# Patient Record
Sex: Male | Born: 1935 | Race: White | Hispanic: No | Marital: Married | Smoking: Former smoker
Health system: Southern US, Community
[De-identification: ages and names within clinical notes are randomized; demographics above are authoritative.]

## PROBLEM LIST (undated history)

## (undated) DIAGNOSIS — I2510 Atherosclerotic heart disease of native coronary artery without angina pectoris: Secondary | ICD-10-CM

## (undated) DIAGNOSIS — I10 Essential (primary) hypertension: Secondary | ICD-10-CM

## (undated) DIAGNOSIS — I5030 Unspecified diastolic (congestive) heart failure: Secondary | ICD-10-CM

## (undated) DIAGNOSIS — Z7901 Long term (current) use of anticoagulants: Secondary | ICD-10-CM

## (undated) DIAGNOSIS — F1010 Alcohol abuse, uncomplicated: Secondary | ICD-10-CM

## (undated) DIAGNOSIS — K219 Gastro-esophageal reflux disease without esophagitis: Secondary | ICD-10-CM

## (undated) DIAGNOSIS — E785 Hyperlipidemia, unspecified: Secondary | ICD-10-CM

## (undated) DIAGNOSIS — I4891 Unspecified atrial fibrillation: Secondary | ICD-10-CM

## (undated) DIAGNOSIS — J449 Chronic obstructive pulmonary disease, unspecified: Secondary | ICD-10-CM

## (undated) HISTORY — DX: Atherosclerotic heart disease of native coronary artery without angina pectoris: I25.10

## (undated) HISTORY — DX: Long term (current) use of anticoagulants: Z79.01

## (undated) HISTORY — DX: Alcohol abuse, uncomplicated: F10.10

## (undated) HISTORY — PX: BREAST SURGERY: SHX581

## (undated) HISTORY — DX: Hyperlipidemia, unspecified: E78.5

## (undated) HISTORY — DX: Chronic obstructive pulmonary disease, unspecified: J44.9

---

## 1997-09-03 ENCOUNTER — Ambulatory Visit (HOSPITAL_BASED_OUTPATIENT_CLINIC_OR_DEPARTMENT_OTHER): Admission: RE | Admit: 1997-09-03 | Discharge: 1997-09-03 | Payer: Self-pay | Admitting: General Surgery

## 1999-06-17 ENCOUNTER — Inpatient Hospital Stay (HOSPITAL_COMMUNITY): Admission: EM | Admit: 1999-06-17 | Discharge: 1999-06-19 | Payer: Self-pay | Admitting: Emergency Medicine

## 1999-06-17 ENCOUNTER — Encounter: Payer: Self-pay | Admitting: Cardiology

## 1999-06-18 ENCOUNTER — Encounter: Payer: Self-pay | Admitting: Cardiology

## 1999-06-18 HISTORY — PX: TRANSESOPHAGEAL ECHOCARDIOGRAM: SHX273

## 1999-06-18 HISTORY — PX: CARDIOVERSION: SHX1299

## 2002-03-09 HISTORY — PX: CARDIAC CATHETERIZATION: SHX172

## 2003-01-09 ENCOUNTER — Ambulatory Visit (HOSPITAL_COMMUNITY): Admission: RE | Admit: 2003-01-09 | Discharge: 2003-01-09 | Payer: Self-pay | Admitting: Cardiovascular Disease

## 2005-03-23 ENCOUNTER — Encounter: Admission: RE | Admit: 2005-03-23 | Discharge: 2005-03-23 | Payer: Self-pay | Admitting: General Surgery

## 2007-04-05 ENCOUNTER — Encounter: Admission: RE | Admit: 2007-04-05 | Discharge: 2007-04-05 | Payer: Self-pay | Admitting: Cardiology

## 2008-12-27 ENCOUNTER — Encounter: Admission: RE | Admit: 2008-12-27 | Discharge: 2008-12-27 | Payer: Self-pay | Admitting: Cardiology

## 2009-10-22 ENCOUNTER — Ambulatory Visit: Payer: Self-pay | Admitting: Cardiology

## 2009-11-13 ENCOUNTER — Ambulatory Visit: Payer: Self-pay | Admitting: Cardiology

## 2009-12-13 ENCOUNTER — Ambulatory Visit: Payer: Self-pay | Admitting: Cardiology

## 2010-01-10 ENCOUNTER — Ambulatory Visit: Payer: Self-pay | Admitting: Cardiology

## 2010-02-10 ENCOUNTER — Ambulatory Visit: Payer: Self-pay | Admitting: Cardiology

## 2010-02-24 ENCOUNTER — Ambulatory Visit: Payer: Self-pay | Admitting: Cardiology

## 2010-03-31 ENCOUNTER — Ambulatory Visit: Payer: Self-pay | Admitting: Cardiology

## 2010-04-14 ENCOUNTER — Ambulatory Visit (INDEPENDENT_AMBULATORY_CARE_PROVIDER_SITE_OTHER): Payer: Medicare Other | Admitting: Cardiology

## 2010-04-14 DIAGNOSIS — I4891 Unspecified atrial fibrillation: Secondary | ICD-10-CM

## 2010-04-14 DIAGNOSIS — J449 Chronic obstructive pulmonary disease, unspecified: Secondary | ICD-10-CM

## 2010-04-14 DIAGNOSIS — R0602 Shortness of breath: Secondary | ICD-10-CM

## 2010-05-12 ENCOUNTER — Encounter (INDEPENDENT_AMBULATORY_CARE_PROVIDER_SITE_OTHER): Payer: Medicare Other

## 2010-05-12 DIAGNOSIS — I4891 Unspecified atrial fibrillation: Secondary | ICD-10-CM

## 2010-05-12 DIAGNOSIS — Z7901 Long term (current) use of anticoagulants: Secondary | ICD-10-CM

## 2010-07-11 ENCOUNTER — Telehealth: Payer: Self-pay | Admitting: Cardiology

## 2010-07-11 NOTE — Telephone Encounter (Signed)
Tried to call busy. Will try Monday-needs pt

## 2010-07-11 NOTE — Telephone Encounter (Signed)
Scott Reese

## 2010-07-14 NOTE — Telephone Encounter (Signed)
Called patient this morning about appointments and protimes.  Patient has not had a protime since march, stating they would not make any appointment for him when he checked out.  Patient has an appointment to get protime and see Dr. Patty Sermons on may 14.  Advised he should have protime checked before, but he wanted to wait.  Asked if he had any increase in bruising, stated no.  Did say he had been dizzy. Offered an appointment to see Lawson Fiscal, Dr. Patty Sermons out of the office this week.  He declined.  Advised to call back if he changed his mind.

## 2010-07-17 ENCOUNTER — Encounter: Payer: Self-pay | Admitting: Cardiology

## 2010-07-18 DIAGNOSIS — I509 Heart failure, unspecified: Secondary | ICD-10-CM | POA: Insufficient documentation

## 2010-07-18 DIAGNOSIS — J441 Chronic obstructive pulmonary disease with (acute) exacerbation: Secondary | ICD-10-CM | POA: Insufficient documentation

## 2010-07-18 DIAGNOSIS — I2510 Atherosclerotic heart disease of native coronary artery without angina pectoris: Secondary | ICD-10-CM | POA: Insufficient documentation

## 2010-07-18 DIAGNOSIS — R0600 Dyspnea, unspecified: Secondary | ICD-10-CM | POA: Insufficient documentation

## 2010-07-18 DIAGNOSIS — E785 Hyperlipidemia, unspecified: Secondary | ICD-10-CM | POA: Insufficient documentation

## 2010-07-18 DIAGNOSIS — R1013 Epigastric pain: Secondary | ICD-10-CM | POA: Insufficient documentation

## 2010-07-18 DIAGNOSIS — I480 Paroxysmal atrial fibrillation: Secondary | ICD-10-CM | POA: Insufficient documentation

## 2010-07-21 ENCOUNTER — Ambulatory Visit (INDEPENDENT_AMBULATORY_CARE_PROVIDER_SITE_OTHER): Payer: Medicare Other | Admitting: *Deleted

## 2010-07-21 ENCOUNTER — Ambulatory Visit (INDEPENDENT_AMBULATORY_CARE_PROVIDER_SITE_OTHER): Payer: Medicare Other | Admitting: Cardiology

## 2010-07-21 ENCOUNTER — Encounter: Payer: Self-pay | Admitting: Cardiology

## 2010-07-21 DIAGNOSIS — I2510 Atherosclerotic heart disease of native coronary artery without angina pectoris: Secondary | ICD-10-CM

## 2010-07-21 DIAGNOSIS — R0989 Other specified symptoms and signs involving the circulatory and respiratory systems: Secondary | ICD-10-CM

## 2010-07-21 DIAGNOSIS — I4891 Unspecified atrial fibrillation: Secondary | ICD-10-CM

## 2010-07-21 DIAGNOSIS — R0600 Dyspnea, unspecified: Secondary | ICD-10-CM

## 2010-07-21 DIAGNOSIS — I480 Paroxysmal atrial fibrillation: Secondary | ICD-10-CM

## 2010-07-21 DIAGNOSIS — I482 Chronic atrial fibrillation, unspecified: Secondary | ICD-10-CM | POA: Insufficient documentation

## 2010-07-21 NOTE — Progress Notes (Signed)
Mariel Craft Date of Birth:  Jun 30, 1927 [facility] / [facility] [address]   [address] [place], [place]  [address] [phone]           Fax   [phone]  HPI: This pleasant 77 year old gentleman is seen for a scheduled followup office visit.  He has a long history of atrial fibrillation.  He has known single vessel coronary disease being treated medically.  He said COPD.  He quit smoking about 20 years ago.  His last nuclear study was in 2007 and was negative for ischemia and his ejection fraction was 61%.  He has been having occasional mild dizzy spells if he bends over to pick something up off the floor.  Current Outpatient Prescriptions  Medication Sig Dispense Refill  . allopurinol (ZYLOPRIM) 300 MG tablet Take 300 mg by mouth daily.        Marland Kitchen atorvastatin (LIPITOR) 10 MG tablet Take 10 mg by mouth daily.        Marland Kitchen esomeprazole (NEXIUM) 40 MG capsule Take 40 mg by mouth daily before breakfast.        . furosemide (LASIX) 40 MG tablet Take 40 mg by mouth daily. 4 tablets       . HYDROcodone-acetaminophen (NORCO) 5-325 MG per tablet Take 1 tablet by mouth daily as needed.        . metoprolol (LOPRESSOR) 100 MG tablet Take 100 mg by mouth daily. 1/2 tablet bid       . nitroGLYCERIN (NITROSTAT) 0.4 MG SL tablet Place 0.4 mg under the tongue every 5 (five) minutes as needed.        . potassium chloride (KLOR-CON) 20 MEQ packet Take 20 mEq by mouth daily.       Marland Kitchen warfarin (COUMADIN) 5 MG tablet Take 5 mg by mouth daily. Take as directed by coumadin clinic       . DISCONTD: celecoxib (CELEBREX) 200 MG capsule Take 200 mg by mouth 2 (two) times daily.          No Known Allergies  Patient Active Problem List  Diagnoses  . Coronary artery disease  . Dyspnea  . COPD (chronic obstructive pulmonary disease)  . Paroxysmal atrial fibrillation  . CHF (congestive heart failure)  . Hyperlipidemia  . Dyspepsia  . Chronic atrial fibrillation    History  Smoking status  .  Former Smoker  . Types: Cigarettes  . Quit date: 03/09/1989  Smokeless tobacco  . Not on file    History  Alcohol Use: Not on file    Family History  Problem Relation Age of Onset  . Diabetes Father   . Stroke Brother   . Cancer Daughter     Review of Systems: The patient denies any heat or cold intolerance.  No weight gain or weight loss.  The patient denies headaches or blurry vision.  There is no cough or sputum production.  The patient denies dizziness.  There is no hematuria or hematochezia.  The patient denies any muscle aches or arthritis.  The patient denies any rash.  The patient denies frequent falling or instability.  There is no history of depression or anxiety.  All other systems were reviewed and are negative.   Physical Exam: Filed Vitals:   07/21/10 0914  BP: 132/78  Pulse: 80  The general appearance feels a well-developed well-nourished gentleman in no distress.Pupils equal and reactive.   Extraocular Movements are full.  There is no scleral icterus.  The mouth and  pharynx are normal.  The neck is supple.  The carotids reveal no bruits.  The jugular venous pressure is normal.  The thyroid is not enlarged.  There is no lymphadenopathy.The chest is clear to percussion and auscultation. There are no rales or rhonchi. Expansion of the chest is symmetrical.The precordium is quiet.  The first heart sound is normal.  The second heart sound is physiologically split.  There is no murmur gallop rub or click.  There is no abnormal lift or heave. The rhythm is irregular.  The abdomen is soft and nontender. Bowel sounds are normal. The liver and spleen are not enlarged. There Are no abdominal masses. There are no bruits.The pedal pulses are good.  There is no phlebitis or edema.  There is no cyanosis or clubbing.Strength is normal and symmetrical in all extremities.  There is no lateralizing weakness.  There are no sensory deficits.    Assessment / Plan: Continue present  medication.  His INR is 1.8 and his dose is being increased.  Recheck in 2 months for followupOffice visit and lab work.

## 2010-07-21 NOTE — Assessment & Plan Note (Signed)
The patient has established chronic atrial fibrillation.  He is on long-term Coumadin.  He has not been having any TIA symptoms or stroke.  He denies exertional chest pain.  He does have occasional dizziness if he leans over toPick something up off the floor.

## 2010-07-21 NOTE — Assessment & Plan Note (Signed)
Patient has a history of exertional dyspnea.  He has a history of COPD.  He is a nonsmoker.  Denies any chest pain or angina.

## 2010-07-21 NOTE — Assessment & Plan Note (Signed)
Patient has known single vessel coronary disease.  Last cardiac catheterization was 2004.  He was found at that time to have a 40-50% eccentric stenosis in the left circumflex.  It was not flow-limiting.  The patient is not experiencing any angina pectoris.

## 2010-07-25 NOTE — Discharge Summary (Signed)
[place]. [facility]  Patient:    Scott Reese, FORGET                         MRN: [id number] Adm. Date:  [id number] Disc. Date: [id number] Attending:  Rudean Hitt CC:         Kristian Covey, M.D.                           Discharge Summary  OPERATIONS PERFORMED:  Transesophageal echocardiogram on June 18, 1999, by Dr. Vesta Mixer, Jr., electrical atrial cardioversion on June 19, 1999, by Dr. Clovis Pu. Brackbill.  FINAL DIAGNOSES: 1. Atrial fibrillation, resolved. 2. Hypertensive cardiovascular disease. 3. Hyperlipidemia.  HISTORY OF PRESENT ILLNESS:  This 77 year old, married Caucasian gentleman was admitted on June 17, 1999, with recent onset of recurrent atrial fibrillation.  He had had previous atrial fibrillation in 1997, was hospitalized, and required electrical cardioversion at that time.  He has remained in sinus rhythm for the past four years until recently.  He had been seen at [facility] last week for follow-up of his essential hypertension and was in sinus rhythm then.  Over the past weekend, the patient drank a little excessively and noted that his pulse was quite irregular subsequently and that he was more short of breath.  PHYSICAL EXAMINATION ON ADMISSION:  VITAL SIGNS:  Blood pressure 126/70, pulse 125 in atrial fibrillation, respirations normal.  LUNGS:  Clear.  HEART:  A quiet precordium without murmur, gallop, or rub.  ABDOMEN:  Negative.  EXTREMITIES:  Show good peripheral pulses.  No phlebitis or edema.  LABORATORY DATA:  Electrocardiogram done at Dr. 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  HOSPITAL COURSE:  Patient was admitted to [facility] telemetry.  He was started on IV heparin and also started on Coumadin.  He was given IV Cardizem for rate control.  It was felt  that the patient should undergo a transesophageal echocardiogram to look for possible visible thrombi in the left atrial appendage.  Patient was begun on Rythmol 300 mg every 12 hours for antiarrhythmic therapy at the time of admission.  On June 18, 1999, patient underwent a cardiac transesophageal echocardiogram by Dr. Elease Hashimoto which showed no evidence of any clot in the left atrium or left atrial appendage, and no mitral regurgitation.  There was a very small central jet of aortic insufficiency.  On April 12, the patient underwent a DC cardioversion.  He converted after a single 200-watt-second shock using the AP paddles.  He tolerated the procedure well and was able to be discharged home that same evening to be followed up closely in the office.  DISCHARGE MEDICATIONS: 1. Metoprolol 100 mg each morning. 2. Lipitor 10 mg daily. 3. Accupril 40 mg daily. 4. Rythmol 300 mg twice a day. 5. Coumadin 5 mg daily or as directed. 6. Cardura 8 mg taking a half tablet in the evenings. 7. Hydrochlorothiazide 25 mg a day.  DISCHARGE INSTRUCTIONS:  He is to walk as tolerated.  He is to stay out of work for two weeks.  He will be on a low-cholesterol, low-salt diet.  He is to stop Lanoxin, stop gemfibrozil, and stop Norvasc.  He is to see Dr. Patty Sermons in one week for office visit, electrocardiogram, and prothrombin time.  CONDITION ON DISCHARGE:  Improved. DD:  07/10/99 TD:  07/11/99 Job: [id number]

## 2010-07-25 NOTE — H&P (Signed)
[place]. [facility]  Patient:    Scott Reese, Scott Reese                           MRN: [id number] Adm. Date:  06/17/99 Attending:  Maisie Fus A. Patty Sermons, M.D. CC:         Kristian Covey, M.D.             Alvia Grove., M.D.                         History and Physical  CHIEF COMPLAINT:  New onset of atrial fibrillation.  HISTORY OF PRESENT ILLNESS:  This is a 77 year old, married, Caucasian gentleman admitted with recent onset of atrial fibrillation.  This man had a history of atrial fibrillation in 1997 and was hospitalized at that time.  He failed to convert on medical therapy at that time and had to be cardioverted.  He required a total of three shocks and converted on the final 360 watt second shock after failing at 150 and failing at 300.  He has remained in sinus rhythm since 1997. He was discharged on Lanoxin and quinidine, Lopressor, Hydrochlorothiazide and Coumadin.  Quinidine was subsequently stopped because of GI intolerance and he as remained on Lanoxin and Lopressor and Hydrochlorothiazide.  Coumadin was continued for several months and the stopped. The patient has been lost to follow up from our office since then but has been followed closely by [facility] or his hypertension and in fact was seen last week for a routine visit, at which time he was noted to be in normal rhythm.  Over the weekend within the past two to three days the patient has not felt as well and this morning when checking his blood pressure on his machine at home, he noted that his pulse was quite irregular. e has not been experiencing any chest discomfort but has had some left arm tingling and numbness.  He has not noticed any symptoms of congestive heart failure or increased dyspnea.  He has had no thromboembolic symptoms.  FAMILY HISTORY:  Reveals that his mother died in her 27s of old age.  Father died of diabetes and complications at age 31.   He has one brother who is living and as had two strokes.  SOCIAL HISTORY:  Reveals that Mr. Marhefka is retired.  He ran a milk route for 30 years.  He now works part-time doing some courier work.  He does not smoke any longer but does drink vodka and sometimes drinks heavily up to a pint at a time. He drank heavily over the weekend, 48 hours ago.  ALLERGIES:  He has no known drug allergies.  REVIEW OF SYSTEMS:  Reveals that he does not have any history of peptic ulcer disease. He has had no history of diverticulosis flare up but does have a history of rectal bleeding in the past if he eats a lot of popcorn.  Genitourinary reveals that he has not been experiencing any dysuria but did notice a lot of nocturia nd polyuria last night possibly related to his atrial arrhythmia.  The patient does have a history of BPH. He denies any cough or sputum production.  PRESENT MEDICATIONS: Lopid generic 600 mg b.i.d.  Norvasc 5 mg q.d. Lipitor 10 g q.d.  Lanoxin 0.125 mg q.d.  Accupril 40 mg q.d.  Metoprolol 100 mg q.d. Hydrochlorothiazide 25 mg q.o.d. Cardura 8  mg q.d.  PHYSICAL EXAMINATION:  VITAL SIGNS:  Blood pressure is 126/70, pulse 125 in atrial fibrillation, respirations normal.  HEAD/NECK:  Revealed that this is a bearded gentleman in no acute distress. Pupils are equal and reactive.  Extraocular movements are full.  The fundi show hemorrhages or exudates. Mouth and pharynx are normal with dentures in place. Jugular venous pressure is normal. Carotids are normal. Thyroid not enlarged or  tender.  There is no lymphadenopathy.  LUNGS:  Clear to percussion and auscultation.  HEART:  Reveals a quiet precordium without murmur, gallop, rub or click.  ABDOMEN:  Soft without hepatosplenomegaly or masses.  EXTREMITIES:  Show good peripheral pulses. No phlebitis or edema.  NEUROLOGIC:  Reveals normal deep tendon reflexes and no evidence of hyperthyroidism.  His electrocardiogram from  Dr. Lupita Leash office earlier today was reviewed and does show atrial fibrillation with a rapid ventricular response. He has relatively low voltage in the limb lead and there are no acute ischemic changes.  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  IMPRESSION: 1. New onset of atrial fibrillation probably in the last two to three days. 2. Hypertensive cardiovascular disease. 3. History of hyperlipidemia. 4. History of episodic alcohol excess.  DISPOSITION:  We are going to admit him to [facility] Telemetry.  We will start him on IV heparin and also start Coumadin. Will start IV Cardizem for rate control and will give him one dose of Rythmol 300 mg now.  We will plan a TEE for June 18, 1999 by Dr. Elease Hashimoto to evaluate LA size and to look for any visible thrombi in the left atrial appendage.  If none are found, we will tentatively plan for DC cardioversion on June 19, 1999.  He will be counselled regarding moderation of alcohol intake in the future. DD:  06/17/99 TD:  06/17/99 Job: [id number]

## 2010-07-25 NOTE — Cardiovascular Report (Signed)
   NAME:  Scott Reese, Scott Reese NO.:  [id number]   MEDICAL RECORD NO.:  [id number]                   PATIENT TYPE:  OIB   LOCATION:  2893                                 FACILITY:  [facility]   PHYSICIAN:  Vesta Mixer, M.D.              DATE OF BIRTH:  February 06, 1928   DATE OF PROCEDURE:  DATE OF DISCHARGE:  01/09/2003                              CARDIAC CATHETERIZATION   INDICATIONS:  Mr. Garske is a 77 year old gentleman with a history of chest  pains and fatigue.  He also has shortness of breath especially with bending  over doing any exertion.  He has recently had a stress Cardiolite study  which revealed inferior basilar attenuation.  He is referred for a heart  catheterization for further evaluation.   PROCEDURE:  Left heart catheterization with coronary angiography.  The right  femoral artery was easily cannulated using a modified Seldinger technique.   HEMODYNAMICS:  The LV pressure was 126/14 with an aortic pressure of 126/70.   ANGIOGRAPHY:  The left main is smooth and normal.   The left anterior descending artery is fairly large and is smooth and  normal.  The diagonal branches are normal as well.   The left circumflex artery is a very large vessel.  The proximal vessel is  approximately 4 mm in diameter.  There is a 40-50% eccentric stenosis in the  proximal aspect of the vessel.  The lesion does not appear to obstruct flow.  The circumflex artery gives off a large obtuse marginal artery and has just  a very small distal circumflex vessel.   The right coronary artery is large and dominant.  The posterior descending  artery and the posterolateral segment artery are normal.   LEFT VENTRICULOGRAM:  The left ventriculogram reveals a mildly enlarged left  ventricle.  The left ventricular systolic function is at the lower limits of  normal with an ejection fraction of around 50%.   COMPLICATIONS:  None.    CONCLUSION:  Single vessel coronary artery  disease involving the left  circumflex artery.  The stenosis is moderate in its severity and is probably  only 40-50% in severity.  It does not appear to be flow limiting.  We will  continue with medical therapy.                                               Vesta Mixer, M.D.    PJN/MEDQ  D:  01/09/2003  T:  01/10/2003  Job:  [id number]   cc:   Cassell Clement, M.D.  [address], [address]  [place]  [place] [address]  Fax: [phone]

## 2010-07-25 NOTE — Op Note (Signed)
[place]. [facility]  Patient:    Scott Reese, Scott Reese                           MRN: [id number] Proc. Date: 06/19/99 Attending:  Maisie Fus A. Patty Sermons, M.D. CC:         Kristian Covey, M.D.             Thomas A. Patty Sermons, M.D.                           Operative Report  PROCEDURE:  Direct current cardioversion.  CARDIOLOGIST:  Clovis Pu. Patty Sermons, M.D.  HISTORY:  This 77 year old gentleman was admitted with atrial fibrillation and failed to convert on oral medication.  He has been anticoagulated.  DESCRIPTION OF PROCEDURE:  He was given 200 mg of pentothal by anesthesiologist, Dr. Zoila Shutter.  After suitable anesthesia had been achieved, the patient was given a single 200 watt second discharge and converted promptly to normal sinus rhythm. There were no post anesthetic complications.  IMPRESSION:  Successful direct current cardioversion. DD:  06/19/99 TD:  06/19/99 Job: [id number]

## 2010-07-25 NOTE — H&P (Signed)
NAME:  NICKY, KRAS NO.:  [id number]   MEDICAL RECORD NO.:  [id number]                   PATIENT TYPE:  OIB   LOCATION:                                       FACILITY:  [facility]   PHYSICIAN:  Vesta Mixer, M.D.              DATE OF BIRTH:  31-Aug-1927   DATE OF ADMISSION:  01/09/2003  DATE OF DISCHARGE:                                HISTORY & PHYSICAL   HISTORY OF PRESENT ILLNESS:  Mr. Okelley is an elderly gentleman with a history  of atrial fibrillation, chest pain, hyperlipidemia, and hypertension.  He is  now admitted to the hospital for heart catheterization after having an  abnormal Cardiolite study.   Mr. Malena is an elderly gentleman with a history of atrial fibrillation.  He  has also had pulmonary hypertension and hypertension.  He has overall done  fairly well.  He has not been hospitalized in quite some time.  Recently, he  had some episodes of chest pain.  Dr. Patty Sermons performed a stress  Cardiolite study which revealed some attenuation along the inferior wall.  He is now admitted for further evaluation of these abnormalities.   He continues to have some intermittent episodes of chest pain.  He has not  had any episodes of syncope or presyncope.  He has not had any shortness of  breath.   He is currently therapeutic on his Coumadin.   CURRENT MEDICATIONS:  1. Lopressor 50 mg twice a day.  2. Lipitor 10 mg a day.  3. Coumadin 5 mg seven days a week.  4. Lasix 80 mg twice a day.   ALLERGIES:  No known drug allergies.   PAST MEDICAL HISTORY:  1. Hyperlipidemia.  2. Hypertension.  3. Pulmonary hypertension.  4. Chronic atrial fibrillation.  5. History of anemia.   SOCIAL HISTORY:  The patient is retired.  He used to run a milk route.   FAMILY HISTORY:  His mother died in her 65s of old age.  His father died of  diabetes and other complications at age 75.   REVIEW OF SYSTEMS:  His review of systems was reviewed and is  essentially  negative.  He denies any problems with his eyes, ears, nose, and throat.  He  denies any heat or cold intolerance, weight gain or weight loss.  He denies  any problems with his eyes, ears, nose, and throat, syncope, presyncope,  rash or skin nodules.  He denies any problems with his GI or GU tract.   PHYSICAL EXAMINATION:  GENERAL:  He is an elderly gentleman in no acute  distress.  He is alert and oriented x3.  His mood and affect are normal.  VITAL SIGNS:  His weight is 212, his blood pressure is 120/80, heart rate of  78.  HEENT:  Reveals 2+ carotids.  He has no bruits.  There is no JVD,  no  thyromegaly.  LUNGS:  Clear to auscultation.  HEART:  Irregularly irregular.  He has no murmurs.  ABDOMEN:  Good bowel sounds and is nontender.  EXTREMITIES:  He has no cyanosis, clubbing, or edema.  NEUROLOGIC:  Nonfocal.   Mr. Knighton presents with some episodes of chest pain and has an abnormal  Cardiolite study with evidence of inferior wall ischemia.  We will proceed  with heart catheterization for further evaluation.   We have discussed the risks, benefits, and options of heart catheterization.  He understands and agrees to proceed.                                                 Vesta Mixer, M.D.    PJN/MEDQ  D:  01/04/2003  T:  01/04/2003  Job:  [id number]   cc:   Tammy R. Collins [place], M.D.  [address]  [place]  [place] [address]  Fax: [phone]   Cassell Clement, M.D.  [address], [address]  [place]  [place] [address]  Fax: [phone]

## 2010-08-12 ENCOUNTER — Ambulatory Visit (INDEPENDENT_AMBULATORY_CARE_PROVIDER_SITE_OTHER): Payer: Medicare Other | Admitting: *Deleted

## 2010-08-12 DIAGNOSIS — I4891 Unspecified atrial fibrillation: Secondary | ICD-10-CM

## 2010-08-12 DIAGNOSIS — I480 Paroxysmal atrial fibrillation: Secondary | ICD-10-CM

## 2010-08-12 LAB — POCT INR: INR: 2.6

## 2010-09-09 ENCOUNTER — Ambulatory Visit (INDEPENDENT_AMBULATORY_CARE_PROVIDER_SITE_OTHER): Payer: Medicare Other | Admitting: *Deleted

## 2010-09-09 DIAGNOSIS — I4891 Unspecified atrial fibrillation: Secondary | ICD-10-CM

## 2010-09-09 DIAGNOSIS — I480 Paroxysmal atrial fibrillation: Secondary | ICD-10-CM

## 2010-09-09 LAB — POCT INR: INR: 2.4

## 2010-09-16 ENCOUNTER — Other Ambulatory Visit: Payer: Self-pay | Admitting: *Deleted

## 2010-09-16 DIAGNOSIS — Z79899 Other long term (current) drug therapy: Secondary | ICD-10-CM

## 2010-09-16 DIAGNOSIS — E785 Hyperlipidemia, unspecified: Secondary | ICD-10-CM

## 2010-09-24 ENCOUNTER — Ambulatory Visit (INDEPENDENT_AMBULATORY_CARE_PROVIDER_SITE_OTHER): Payer: Medicare Other | Admitting: Cardiology

## 2010-09-24 ENCOUNTER — Other Ambulatory Visit (INDEPENDENT_AMBULATORY_CARE_PROVIDER_SITE_OTHER): Payer: Medicare Other | Admitting: *Deleted

## 2010-09-24 ENCOUNTER — Ambulatory Visit (INDEPENDENT_AMBULATORY_CARE_PROVIDER_SITE_OTHER): Payer: Medicare Other | Admitting: *Deleted

## 2010-09-24 ENCOUNTER — Other Ambulatory Visit: Payer: Medicare Other | Admitting: *Deleted

## 2010-09-24 ENCOUNTER — Other Ambulatory Visit: Payer: Self-pay | Admitting: Cardiology

## 2010-09-24 ENCOUNTER — Encounter: Payer: Self-pay | Admitting: Cardiology

## 2010-09-24 DIAGNOSIS — Z79899 Other long term (current) drug therapy: Secondary | ICD-10-CM

## 2010-09-24 DIAGNOSIS — I4891 Unspecified atrial fibrillation: Secondary | ICD-10-CM

## 2010-09-24 DIAGNOSIS — R0989 Other specified symptoms and signs involving the circulatory and respiratory systems: Secondary | ICD-10-CM

## 2010-09-24 DIAGNOSIS — I482 Chronic atrial fibrillation, unspecified: Secondary | ICD-10-CM

## 2010-09-24 DIAGNOSIS — I2510 Atherosclerotic heart disease of native coronary artery without angina pectoris: Secondary | ICD-10-CM

## 2010-09-24 DIAGNOSIS — R0600 Dyspnea, unspecified: Secondary | ICD-10-CM

## 2010-09-24 DIAGNOSIS — E785 Hyperlipidemia, unspecified: Secondary | ICD-10-CM

## 2010-09-24 DIAGNOSIS — I480 Paroxysmal atrial fibrillation: Secondary | ICD-10-CM

## 2010-09-24 LAB — LDL CHOLESTEROL, DIRECT: Direct LDL: 74.3 mg/dL

## 2010-09-24 LAB — BASIC METABOLIC PANEL
GFR: 66.89 mL/min (ref >60.00)
Potassium: 5.1 mEq/L (ref 3.5–5.1)
Sodium: 145 mEq/L (ref 135–145)

## 2010-09-24 LAB — HEPATIC FUNCTION PANEL
AST: 25 U/L (ref 0–37)
Alkaline Phosphatase: 53 U/L (ref 39–117)
Total Bilirubin: 0.7 mg/dL (ref 0.3–1.2)

## 2010-09-24 LAB — CBC WITH DIFFERENTIAL/PLATELET
Basophils Absolute: 0 10*3/uL (ref 0.0–0.1)
Hemoglobin: 14.4 g/dL (ref 13.0–17.0)
Lymphocytes Relative: 17.4 % (ref 12.0–46.0)
Monocytes Relative: 8.8 % (ref 3.0–12.0)
Neutro Abs: 4.4 10*3/uL (ref 1.4–7.7)
Neutrophils Relative %: 72.2 % (ref 43.0–77.0)
RDW: 17.4 % — ABNORMAL HIGH (ref 11.5–14.6)

## 2010-09-24 LAB — LIPID PANEL
Cholesterol: 150 mg/dL (ref 0–200)
Total CHOL/HDL Ratio: 4
VLDL: 49 mg/dL — ABNORMAL HIGH (ref 0.0–40.0)

## 2010-09-24 NOTE — Assessment & Plan Note (Signed)
The patient has a history of chronic atrial fibrillation on Coumadin.  He has been careful about his medication and his INRs have been therapeutic.  He has not had any TIA or stroke symptoms

## 2010-09-24 NOTE — Assessment & Plan Note (Signed)
The patient is seen for a scheduled followup office visit.  He does have known coronary artery disease.  He has not been as was any recent chest pain.  He had cardiac catheterization in 2004 showing single-vessel disease which was nonobstructive in the left circumflex.

## 2010-09-24 NOTE — Assessment & Plan Note (Addendum)
The patient has a history of exertional dyspnea.  He is a former smoker.  His last chest x-ray was 12/27/08 and showed moderate cardiomegaly.  He is not coughing up any sputum.  He's had no fever or hemoptysis his last nuclear stress test was 05/06/09 and showed no ischemia and his ejection fraction had improved from 46% to 61%.  His last echocardiogram was in 2005 and showed aortic valve sclerosis with mild aortic insufficiency and mitral annular calcification with moderate mitral regurgitation.

## 2010-09-24 NOTE — Progress Notes (Signed)
Scott Reese Date of Birth:  [birth year] [facility] / [facility] [address]   [address] [place], [place]  [address] [phone]           Fax   [phone]  History of Present Illness: This pleasant 77 year old gentleman is seen for a scheduled followup office visit.  He has a past history of exertional dyspnea.  He is a former smoker and has COPD.  He also has known cardiomegaly.  His last chest x-ray was in 12/27/08 and showed moderate cardiomegaly as well as patchy atelectasis.  The patient had a nuclear stress test 05/06/09 showing normal perfusion and an ejection fraction of 61%.  His last echocardiogram was in 2005 and showed moderate mitral regurgitation and moderate pulmonary hypertension.  The patient has been in chronic atrial fibrillation and is on Coumadin.  He's had no TIA symptoms.  Current Outpatient Prescriptions  Medication Sig Dispense Refill  . allopurinol (ZYLOPRIM) 300 MG tablet Take 300 mg by mouth daily.        Marland Kitchen atorvastatin (LIPITOR) 10 MG tablet Take 10 mg by mouth daily.        Marland Kitchen esomeprazole (NEXIUM) 40 MG capsule Take 40 mg by mouth daily before breakfast.        . furosemide (LASIX) 40 MG tablet Take 40 mg by mouth daily. 4 tablets daily      . HYDROcodone-acetaminophen (NORCO) 5-325 MG per tablet Take 1 tablet by mouth daily as needed.        . metoprolol (LOPRESSOR) 100 MG tablet Take 100 mg by mouth daily. Taking one in the am and 1/2 at night       . nitroGLYCERIN (NITROSTAT) 0.4 MG SL tablet Place 0.4 mg under the tongue every 5 (five) minutes as needed.        . potassium chloride (KLOR-CON) 20 MEQ packet Take 20 mEq by mouth daily.       Marland Kitchen warfarin (COUMADIN) 5 MG tablet Take 5 mg by mouth daily. Take as directed by coumadin clinic         No Known Allergies  Patient Active Problem List  Diagnoses  . Coronary artery disease  . Dyspnea  . COPD (chronic obstructive pulmonary disease)  . Paroxysmal atrial fibrillation  . CHF  (congestive heart failure)  . Hyperlipidemia  . Dyspepsia  . Chronic atrial fibrillation    History  Smoking status  . Former Smoker  . Types: Cigarettes  . Quit date: 03/09/1989  Smokeless tobacco  . Not on file    History  Alcohol Use: Not on file    Family History  Problem Relation Age of Onset  . Diabetes Father   . Stroke Brother   . Cancer Daughter     Review of Systems: Constitutional: no fever chills diaphoresis or fatigue or change in weight.  Head and neck: no hearing loss, no epistaxis, no photophobia or visual disturbance. Respiratory: No cough, shortness of breath or wheezing. Cardiovascular: No chest pain peripheral edema, palpitations. Gastrointestinal: No abdominal distention, no abdominal pain, no change in bowel habits hematochezia or melena. Genitourinary: No dysuria, no frequency, no urgency, no nocturia. Musculoskeletal:No arthralgias, no back pain, no gait disturbance or myalgias. Neurological: No dizziness, no headaches, no numbness, no seizures, no syncope, no weakness, no tremors. Hematologic: No lymphadenopathy, no easy bruising. Psychiatric: No confusion, no hallucinations, no sleep disturbance.    Physical Exam: Filed Vitals:   09/24/10 0830  BP: 124/72  Pulse: 74  The general appearance reveals a well-developed gentleman in no distress.Pupils equal and reactive.   Extraocular Movements are full.  There is no scleral icterus.  The mouth and pharynx are normal.  The neck is supple.  The carotids reveal no bruits.  The jugular venous pressure is normal.  The thyroid is not enlarged.  There is no lymphadenopathy.  The chest is clear to percussion and auscultation. There are no rales or rhonchi. Expansion of the chest is symmetrical.    Heart reveals a soft systolic apical murmur and no gallop or rub. The abdomen is soft and nontender. Bowel sounds are normal. The liver and spleen are not enlarged. There Are no abdominal masses. There are no  bruits.  The pedal pulses are good.  There is no phlebitis or edema.  There is no cyanosis or clubbing.  Strength is normal and symmetrical in all extremities.  There is no lateralizing weakness.  There are no sensory deficits.     Assessment / Plan: Continue same medication recheck in 3 months.  If dyspnea remains a severe problem we will consider chest x-ray and updating his echocardiogram.  Blood work today is pending

## 2010-09-25 ENCOUNTER — Telehealth: Payer: Self-pay | Admitting: *Deleted

## 2010-09-25 NOTE — Telephone Encounter (Signed)
Advised patient of lab results and he will call several weeks prior to next appt. To get CXR

## 2010-10-22 ENCOUNTER — Ambulatory Visit (INDEPENDENT_AMBULATORY_CARE_PROVIDER_SITE_OTHER): Payer: Medicare Other | Admitting: *Deleted

## 2010-10-22 DIAGNOSIS — I480 Paroxysmal atrial fibrillation: Secondary | ICD-10-CM

## 2010-10-22 DIAGNOSIS — I4891 Unspecified atrial fibrillation: Secondary | ICD-10-CM

## 2010-10-22 LAB — POCT INR: INR: 2.1

## 2010-11-03 ENCOUNTER — Other Ambulatory Visit: Payer: Self-pay | Admitting: Cardiology

## 2010-11-03 DIAGNOSIS — I4891 Unspecified atrial fibrillation: Secondary | ICD-10-CM

## 2010-11-03 MED ORDER — METOPROLOL TARTRATE 100 MG PO TABS: 100.0000 mg | ORAL_TABLET | Freq: Every day | ORAL | Status: DC

## 2010-11-03 NOTE — Telephone Encounter (Signed)
Pt needs a new script for Metoprolol.  The dosage was increased and he was taking 1 a day and now it is 1 1/2 a day.  Pt was at pharmacy waiting but he is aware this may not be done in the next 30 minutes.  Please call pt back to confirm this has been done so he can go back to pick up.  [facility] states the can give him a few if he is out.

## 2010-11-03 NOTE — Telephone Encounter (Signed)
Called and advised done.

## 2010-11-19 ENCOUNTER — Ambulatory Visit (INDEPENDENT_AMBULATORY_CARE_PROVIDER_SITE_OTHER): Payer: Medicare Other | Admitting: *Deleted

## 2010-11-19 DIAGNOSIS — I480 Paroxysmal atrial fibrillation: Secondary | ICD-10-CM

## 2010-11-19 DIAGNOSIS — I4891 Unspecified atrial fibrillation: Secondary | ICD-10-CM

## 2010-11-20 ENCOUNTER — Other Ambulatory Visit: Payer: Self-pay | Admitting: *Deleted

## 2010-11-20 DIAGNOSIS — I519 Heart disease, unspecified: Secondary | ICD-10-CM

## 2010-11-20 MED ORDER — FUROSEMIDE 40 MG PO TABS: 40.0000 mg | ORAL_TABLET | Freq: Every day | ORAL | Status: DC

## 2010-11-20 NOTE — Telephone Encounter (Signed)
Refilled furosemide

## 2010-12-05 ENCOUNTER — Other Ambulatory Visit: Payer: Self-pay | Admitting: *Deleted

## 2010-12-05 DIAGNOSIS — E785 Hyperlipidemia, unspecified: Secondary | ICD-10-CM

## 2010-12-05 MED ORDER — ATORVASTATIN CALCIUM 10 MG PO TABS: 10.0000 mg | ORAL_TABLET | Freq: Every day | ORAL | Status: DC

## 2010-12-05 NOTE — Telephone Encounter (Signed)
Refilled meds per fax request.  

## 2010-12-15 ENCOUNTER — Other Ambulatory Visit: Payer: Self-pay | Admitting: *Deleted

## 2010-12-15 MED ORDER — ALLOPURINOL 300 MG PO TABS: 300.0000 mg | ORAL_TABLET | Freq: Every day | ORAL | Status: DC

## 2010-12-16 ENCOUNTER — Other Ambulatory Visit: Payer: Self-pay | Admitting: *Deleted

## 2010-12-16 ENCOUNTER — Ambulatory Visit
Admission: RE | Admit: 2010-12-16 | Discharge: 2010-12-16 | Disposition: A | Discharge status: Home / Self Care | Payer: Medicare Other | Source: Ambulatory Visit | Attending: Cardiology | Admitting: Cardiology

## 2010-12-16 DIAGNOSIS — R0600 Dyspnea, unspecified: Secondary | ICD-10-CM

## 2010-12-18 ENCOUNTER — Telehealth: Payer: Self-pay | Admitting: Cardiology

## 2010-12-18 ENCOUNTER — Telehealth: Payer: Self-pay | Admitting: *Deleted

## 2010-12-18 ENCOUNTER — Other Ambulatory Visit: Payer: Self-pay

## 2010-12-18 MED ORDER — ALLOPURINOL 300 MG PO TABS: 300.0000 mg | ORAL_TABLET | Freq: Every day | ORAL | Status: DC

## 2010-12-18 NOTE — Progress Notes (Signed)
Left message

## 2010-12-18 NOTE — Progress Notes (Signed)
Advised 

## 2010-12-18 NOTE — Telephone Encounter (Signed)
Message copied by Burnell Blanks on Thu Dec 18, 2010  2:51 PM ------      Message from: Cassell Clement      Created: Tue Dec 16, 2010  3:56 PM       Please report.  The chest x-ray shows an enlarged heart as before, but no new pulmonary findings.  There is some scarring, which may be affecting his breathing.  Discuss further at office visit

## 2010-12-18 NOTE — Telephone Encounter (Signed)
Pt returning call to [place]. Please call pt back.

## 2010-12-18 NOTE — Telephone Encounter (Signed)
Advised of chest xray results 

## 2010-12-18 NOTE — Telephone Encounter (Signed)
Advised of xray results 

## 2010-12-23 ENCOUNTER — Ambulatory Visit (INDEPENDENT_AMBULATORY_CARE_PROVIDER_SITE_OTHER): Payer: Medicare Other | Admitting: *Deleted

## 2010-12-23 ENCOUNTER — Ambulatory Visit (INDEPENDENT_AMBULATORY_CARE_PROVIDER_SITE_OTHER): Payer: Medicare Other | Admitting: Cardiology

## 2010-12-23 ENCOUNTER — Encounter: Payer: Self-pay | Admitting: Cardiology

## 2010-12-23 VITALS — BP 128/74 | HR 78 | Ht 68.0 in | Wt 205.0 lb

## 2010-12-23 DIAGNOSIS — R42 Dizziness and giddiness: Secondary | ICD-10-CM | POA: Insufficient documentation

## 2010-12-23 DIAGNOSIS — I4891 Unspecified atrial fibrillation: Secondary | ICD-10-CM

## 2010-12-23 DIAGNOSIS — E785 Hyperlipidemia, unspecified: Secondary | ICD-10-CM | POA: Insufficient documentation

## 2010-12-23 DIAGNOSIS — M109 Gout, unspecified: Secondary | ICD-10-CM

## 2010-12-23 DIAGNOSIS — I480 Paroxysmal atrial fibrillation: Secondary | ICD-10-CM

## 2010-12-23 DIAGNOSIS — J449 Chronic obstructive pulmonary disease, unspecified: Secondary | ICD-10-CM

## 2010-12-23 LAB — POCT INR: INR: 2

## 2010-12-23 NOTE — Assessment & Plan Note (Signed)
The patient has been experiencing symptoms of dizziness and unsteadiness and poor balance for about the past 6 months.  He thinks it may be related to the fact that his vision is deteriorating in hopes that this will be improved after he has his cataract surgery.  Is not having any symptoms of motion sickness or is not experiencing any nausea or vomiting

## 2010-12-23 NOTE — Progress Notes (Signed)
Scott Reese Date of Birth:  12-27-1927 [facility] / [facility] [address]   [address] [place], [place]  [address] [phone]           Fax   [phone]  History of Present Illness: This pleasant 77 year old gentleman is seen for a scheduled 3 month followup office visit.  He has a history of exertional angina.  He is a former smoker and has COPD.  At his last visit we arranged for a chest x-ray, which showed moderate cardiomegaly, but no acute findings, and no CHF.  He does have some scarring at the bases.  He had a nuclear stress test in February 2011 showing normal perfusion and an ejection fraction of 61%.  He has moderate mitral regurgitation by previous echocardiogram in 2005.  He is on chronic Coumadin for established atrial fibrillation.  His last, visit.  He's had no new cardiac symptoms.  He is scheduled for cataract surgery on 01/26/11 by Dr. Darel Hong.  Current Outpatient Prescriptions  Medication Sig Dispense Refill  . allopurinol (ZYLOPRIM) 300 MG tablet Take 1 tablet (300 mg total) by mouth daily.  30 tablet  11  . atorvastatin (LIPITOR) 10 MG tablet Take 1 tablet (10 mg total) by mouth daily.  30 tablet  11  . esomeprazole (NEXIUM) 40 MG capsule Take 40 mg by mouth daily before breakfast.        . furosemide (LASIX) 40 MG tablet Take 40 mg by mouth daily. 2 tablets daily       . metoprolol (LOPRESSOR) 100 MG tablet Take 100 mg by mouth as directed. Take 1/2 tablet twice daily        . nitroGLYCERIN (NITROSTAT) 0.4 MG SL tablet Place 0.4 mg under the tongue every 5 (five) minutes as needed.        . potassium chloride (KLOR-CON) 20 MEQ packet Take 20 mEq by mouth daily.       Marland Kitchen warfarin (COUMADIN) 5 MG tablet Take 5 mg by mouth daily. Take as directed by coumadin clinic       . HYDROcodone-acetaminophen (NORCO) 5-325 MG per tablet Take 1 tablet by mouth daily as needed.          No Known Allergies  Patient Active Problem List  Diagnoses  . Coronary  artery disease  . Dyspnea  . COPD (chronic obstructive pulmonary disease)  . Paroxysmal atrial fibrillation  . CHF (congestive heart failure)  . Hyperlipidemia  . Dyspepsia  . Chronic atrial fibrillation  . Dyslipidemia    History  Smoking status  . Former Smoker  . Types: Cigarettes  . Quit date: 03/09/1989  Smokeless tobacco  . Not on file    History  Alcohol Use: Not on file    Family History  Problem Relation Age of Onset  . Diabetes Father   . Stroke Brother   . Cancer Daughter     Review of Systems: Constitutional: no fever chills diaphoresis or fatigue or change in weight.  Head and neck: no hearing loss, no epistaxis, no photophobia or visual disturbance. Respiratory: No cough, shortness of breath or wheezing. Cardiovascular: No chest pain peripheral edema, palpitations. Gastrointestinal: No abdominal distention, no abdominal pain, no change in bowel habits hematochezia or melena. Genitourinary: No dysuria, no frequency, no urgency, no nocturia. Musculoskeletal:No arthralgias, no back pain, no gait disturbance or myalgias. Neurological: No dizziness, no headaches, no numbness, no seizures, no syncope, no weakness, no tremors. Hematologic: No lymphadenopathy, no easy bruising. Psychiatric: No  confusion, no hallucinations, no sleep disturbance.    Physical Exam: Filed Vitals:   12/23/10 1006  BP: 128/74  Pulse: 78   the general appearance reveals an elderly gentleman in no acute distress.Pupils equal and reactive.   Extraocular Movements are full.  There is no scleral icterus.  The mouth and pharynx are normal.  The neck is supple.  The carotids reveal no bruits.  The jugular venous pressure is normal.  The thyroid is not enlarged.  There is no lymphadenopathy.  The pupils are equal and reactive.  There is no nystagmus.The chest is clear to percussion and auscultation. There are no rales or rhonchi. Expansion of the chest is symmetrical.  The precordium is  quiet.  The first heart sound is normal.  The second heart sound is physiologically split.  There is no murmur gallop rub or click.  There is no abnormal lift or heave.  The rhythm is irregularThe abdomen is soft and nontender. Bowel sounds are normal. The liver and spleen are not enlarged. There Are no abdominal masses. There are no bruits.  Extremities reveal trace ankle edema.  He has 1+ dorsalis pedis and posterior tibial pulses bilaterally.  He is a former smoker, having quit about 20 years ago.   Assessment / Plan: Continue same medication except change the timing of his metoprolol to 50 mg twice a day.  Return in 3 months for office visit and fasting lipids.  Will also check a CBC and uric acid

## 2010-12-23 NOTE — Assessment & Plan Note (Signed)
The patient has established atrial fibrillation, on Coumadin.  He's not having any side effects from the Coumadin.  He has been taking his entire metoprolol tartrate 2 mg in the morning and this may be causing excessive bradycardia, which may be exacerbating his dizziness.  We will change the timing of his beta blocker, so that he takes half of a tablet twice a day

## 2010-12-23 NOTE — Patient Instructions (Signed)
Decrease your Metoprolol to 100 mg 1/2 twice daily Your physician wants you to follow-up in: 3 months You will receive a reminder letter in the mail two months in advance. If you don't receive a letter, please call our office to schedule the follow-up appointment.

## 2010-12-23 NOTE — Assessment & Plan Note (Signed)
The patient is on Lipitor 10 mg daily.  Is not having myalgias from Lipitor.  His last lipids 3 months ago were satisfactory

## 2010-12-24 ENCOUNTER — Other Ambulatory Visit: Payer: Self-pay | Admitting: *Deleted

## 2010-12-24 MED ORDER — ALLOPURINOL 300 MG PO TABS: 300.0000 mg | ORAL_TABLET | Freq: Every day | ORAL | Status: DC

## 2010-12-24 NOTE — Telephone Encounter (Signed)
Refilled allopurinol 

## 2010-12-26 ENCOUNTER — Other Ambulatory Visit: Payer: Self-pay | Admitting: Cardiology

## 2010-12-26 MED ORDER — ALLOPURINOL 300 MG PO TABS: 300.0000 mg | ORAL_TABLET | Freq: Every day | ORAL | Status: DC

## 2011-01-01 ENCOUNTER — Other Ambulatory Visit: Payer: Self-pay | Admitting: *Deleted

## 2011-01-01 MED ORDER — ALLOPURINOL 300 MG PO TABS: 300.0000 mg | ORAL_TABLET | Freq: Every day | ORAL | Status: DC

## 2011-01-01 NOTE — Telephone Encounter (Signed)
Refilled allopurinol 

## 2011-01-03 ENCOUNTER — Other Ambulatory Visit: Payer: Self-pay | Admitting: Cardiology

## 2011-01-05 NOTE — Telephone Encounter (Signed)
Refilled allopurinol 

## 2011-02-03 ENCOUNTER — Ambulatory Visit (INDEPENDENT_AMBULATORY_CARE_PROVIDER_SITE_OTHER): Payer: Medicare Other | Admitting: *Deleted

## 2011-02-03 DIAGNOSIS — I480 Paroxysmal atrial fibrillation: Secondary | ICD-10-CM

## 2011-02-03 DIAGNOSIS — I4891 Unspecified atrial fibrillation: Secondary | ICD-10-CM

## 2011-03-17 ENCOUNTER — Encounter: Payer: Medicare Other | Admitting: *Deleted

## 2011-03-18 ENCOUNTER — Encounter: Payer: Self-pay | Admitting: Cardiology

## 2011-03-18 ENCOUNTER — Other Ambulatory Visit: Payer: Self-pay | Admitting: Cardiology

## 2011-03-18 ENCOUNTER — Other Ambulatory Visit (INDEPENDENT_AMBULATORY_CARE_PROVIDER_SITE_OTHER): Payer: Medicare Other | Admitting: *Deleted

## 2011-03-18 ENCOUNTER — Ambulatory Visit (INDEPENDENT_AMBULATORY_CARE_PROVIDER_SITE_OTHER): Payer: Medicare Other | Admitting: Cardiology

## 2011-03-18 ENCOUNTER — Ambulatory Visit (INDEPENDENT_AMBULATORY_CARE_PROVIDER_SITE_OTHER): Payer: Medicare Other | Admitting: *Deleted

## 2011-03-18 VITALS — BP 118/78 | HR 70 | Ht 68.0 in | Wt 203.0 lb

## 2011-03-18 DIAGNOSIS — R42 Dizziness and giddiness: Secondary | ICD-10-CM

## 2011-03-18 DIAGNOSIS — R1013 Epigastric pain: Secondary | ICD-10-CM

## 2011-03-18 DIAGNOSIS — E7800 Pure hypercholesterolemia, unspecified: Secondary | ICD-10-CM

## 2011-03-18 DIAGNOSIS — E785 Hyperlipidemia, unspecified: Secondary | ICD-10-CM

## 2011-03-18 DIAGNOSIS — M109 Gout, unspecified: Secondary | ICD-10-CM

## 2011-03-18 DIAGNOSIS — I2510 Atherosclerotic heart disease of native coronary artery without angina pectoris: Secondary | ICD-10-CM

## 2011-03-18 DIAGNOSIS — I119 Hypertensive heart disease without heart failure: Secondary | ICD-10-CM

## 2011-03-18 DIAGNOSIS — I4891 Unspecified atrial fibrillation: Secondary | ICD-10-CM

## 2011-03-18 DIAGNOSIS — I482 Chronic atrial fibrillation, unspecified: Secondary | ICD-10-CM

## 2011-03-18 DIAGNOSIS — I480 Paroxysmal atrial fibrillation: Secondary | ICD-10-CM

## 2011-03-18 LAB — CBC WITH DIFFERENTIAL/PLATELET
Basophils Relative: 0.4 % (ref 0.0–3.0)
Eosinophils Relative: 0.8 % (ref 0.0–5.0)
HCT: 41.8 % (ref 39.0–52.0)
Lymphs Abs: 1 10*3/uL (ref 0.7–4.0)
MCHC: 32.5 g/dL (ref 30.0–36.0)
MCV: 92.5 fl (ref 78.0–100.0)
Monocytes Absolute: 0.6 10*3/uL (ref 0.1–1.0)
Neutro Abs: 4.5 10*3/uL (ref 1.4–7.7)
RBC: 4.52 Mil/uL (ref 4.22–5.81)
WBC: 6.2 10*3/uL (ref 4.5–10.5)

## 2011-03-18 LAB — BASIC METABOLIC PANEL
BUN: 27 mg/dL — ABNORMAL HIGH (ref 6–23)
GFR: 60.53 mL/min (ref >60.00)
Potassium: 4.3 mEq/L (ref 3.5–5.1)
Sodium: 144 mEq/L (ref 135–145)

## 2011-03-18 LAB — LIPID PANEL
Cholesterol: 154 mg/dL (ref 0–200)
HDL: 39.5 mg/dL (ref >39.00)
Triglycerides: 218 mg/dL — ABNORMAL HIGH (ref 0.0–149.0)
VLDL: 43.6 mg/dL — ABNORMAL HIGH (ref 0.0–40.0)

## 2011-03-18 LAB — HEPATIC FUNCTION PANEL
ALT: 23 U/L (ref 0–53)
Albumin: 3.7 g/dL (ref 3.5–5.2)
Total Protein: 6.7 g/dL (ref 6.0–8.3)

## 2011-03-18 MED ORDER — OMEPRAZOLE 40 MG PO CPDR: 40.0000 mg | DELAYED_RELEASE_CAPSULE | Freq: Every day | ORAL | Status: DC

## 2011-03-18 NOTE — Assessment & Plan Note (Signed)
The patient has not had any recent exertional chest pain or angina.

## 2011-03-18 NOTE — Progress Notes (Signed)
Scott Reese Date of Birth:  10/09/1927 [facility] [address] [place], [place]  [address] [phone]         Fax   [phone]  History of Present Illness: This pleasant 78 year old gentleman is seen for a scheduled 3 month followup office visit.  He has a history of ischemic heart disease and exertional angina pectoris.  He no longer smokes.  He does have COPD.  His most recent chest x-ray showed moderate cardiomegaly but no heart failure or acute findings.  His last nuclear stress test in every 2011 showed normal perfusion and his ejection fraction was 61%.  He does have known moderate mitral regurgitation by previous echocardiogram in 2005.  He is in chronic established atrial fibrillation and is on Coumadin.  Current Outpatient Prescriptions  Medication Sig Dispense Refill  . allopurinol (ZYLOPRIM) 300 MG tablet TAKE 1 TABLET BY MOUTH EVERY DAY  90 tablet  3  . atorvastatin (LIPITOR) 10 MG tablet Take 1 tablet (10 mg total) by mouth daily.  30 tablet  11  . furosemide (LASIX) 40 MG tablet Take 40 mg by mouth daily. 2 tablets daily       . HYDROcodone-acetaminophen (NORCO) 5-325 MG per tablet Take 1 tablet by mouth daily as needed.        . metoprolol (LOPRESSOR) 100 MG tablet Take 100 mg by mouth as directed. Take 1/2 tablet twice daily        . nitroGLYCERIN (NITROSTAT) 0.4 MG SL tablet Place 0.4 mg under the tongue every 5 (five) minutes as needed.        . potassium chloride (KLOR-CON) 20 MEQ packet Take 20 mEq by mouth daily.       Marland Kitchen warfarin (COUMADIN) 5 MG tablet Take 5 mg by mouth daily. Take as directed by coumadin clinic       . omeprazole (PRILOSEC) 40 MG capsule Take 1 capsule (40 mg total) by mouth daily.  30 capsule  11    No Known Allergies  Patient Active Problem List  Diagnoses  . Coronary artery disease  . Dyspnea  . COPD (chronic obstructive pulmonary disease)  . Paroxysmal atrial fibrillation  . CHF (congestive heart failure)  .  Hyperlipidemia  . Dyspepsia  . Chronic atrial fibrillation  . Dyslipidemia  . Dizziness    History  Smoking status  . Former Smoker  . Types: Cigarettes  . Quit date: 03/09/1989  Smokeless tobacco  . Not on file    History  Alcohol Use: Not on file    Family History  Problem Relation Age of Onset  . Diabetes Father   . Stroke Brother   . Cancer Daughter     Review of Systems: Constitutional: no fever chills diaphoresis or fatigue or change in weight.  Head and neck: no hearing loss, no epistaxis, no photophobia or visual disturbance. Respiratory: No cough, shortness of breath or wheezing. Cardiovascular: No chest pain peripheral edema, palpitations. Gastrointestinal: No abdominal distention, no abdominal pain, no change in bowel habits hematochezia or melena. Genitourinary: No dysuria, no frequency, no urgency, no nocturia. Musculoskeletal:No arthralgias, no back pain, no gait disturbance or myalgias. Neurological: No dizziness, no headaches, no numbness, no seizures, no syncope, no weakness, no tremors. Hematologic: No lymphadenopathy, no easy bruising. Psychiatric: No confusion, no hallucinations, no sleep disturbance.    Physical Exam: Filed Vitals:   03/18/11 0929  BP: 118/78  Pulse: 70   The general appearance reveals a well-developed well-nourished elderly gentleman in  no distress.Pupils equal and reactive.   Extraocular Movements are full.  There is no scleral icterus.  He is recovering from right eye cataract surgery.  The mouth and pharynx are normal.  The neck is supple.  The carotids reveal no bruits.  The jugular venous pressure is normal.  The thyroid is not enlarged.  There is no lymphadenopathy. The chest is clear to percussion and auscultation. There are no rales or rhonchi. Expansion of the chest is symmetrical.  The heart reveals a soft apical systolic murmur.  Rhythm is irregular.  No gallop or rub. The abdomen is soft and nontender. Bowel sounds  are normal. The liver and spleen are not enlarged. There Are no abdominal masses. There are no bruits.  The pedal pulses are good.  There is no phlebitis or edema.  There is no cyanosis or clubbing. Strength is normal and symmetrical in all extremities.  There is no lateralizing weakness.  There are no sensory deficits.  The skin is warm and dry.  There is no rash.    Assessment / Plan: We are checking lab work today.  He will return in 3 months for followup office visit and EKG

## 2011-03-18 NOTE — Assessment & Plan Note (Signed)
Patient has not had any TIA symptoms from his atrial fibrillation.

## 2011-03-18 NOTE — Assessment & Plan Note (Signed)
The patient is tolerating a low dose Lipitor for his dyslipidemia.  He's not had any myalgias.

## 2011-03-18 NOTE — Patient Instructions (Signed)
Discontinue Nexium and start Omeprazole 40 mg daily Will obtain labs today and call you with the results (lp/bmet/hfp/cbc/uric acid) Your physician wants you to follow-up in: 3 montsh You will receive a reminder letter in the mail two months in advance. If you don't receive a letter, please call our office to schedule the follow-up appointment.

## 2011-03-18 NOTE — Assessment & Plan Note (Signed)
The patient has been on Nexium for his dyspepsia.  It is quite expensive for him and we are going to switch him to generic omeprazole 40 mg one daily.

## 2011-03-20 ENCOUNTER — Telehealth: Payer: Self-pay | Admitting: *Deleted

## 2011-03-20 ENCOUNTER — Telehealth: Payer: Self-pay | Admitting: Cardiology

## 2011-03-20 NOTE — Telephone Encounter (Signed)
Left message

## 2011-03-20 NOTE — Telephone Encounter (Signed)
Mailed copy of labs and left message to call if any questions  

## 2011-03-20 NOTE — Telephone Encounter (Signed)
Message copied by Burnell Blanks on Fri Mar 20, 2011 12:03 PM ------      Message from: Cassell Clement      Created: Wed Mar 18, 2011  8:56 PM       Please report.  The labs are stable.  Continue same meds.  Continue careful diet.

## 2011-03-20 NOTE — Telephone Encounter (Signed)
Message copied by Burnell Blanks on Fri Mar 20, 2011 12:02 PM ------      Message from: Cassell Clement      Created: Wed Mar 18, 2011  8:55 PM       Uric acid normal.Please report.  The labs are stable.  Continue same meds.  Continue careful diet.

## 2011-03-20 NOTE — Telephone Encounter (Signed)
Follow- up: ° ° °Patient returned your phone call. Please call back. °

## 2011-03-24 NOTE — Telephone Encounter (Signed)
Patient never called back.  Had called about labs, mailed him a copy

## 2011-04-15 ENCOUNTER — Ambulatory Visit (INDEPENDENT_AMBULATORY_CARE_PROVIDER_SITE_OTHER): Payer: Medicare Other | Admitting: *Deleted

## 2011-04-15 DIAGNOSIS — I480 Paroxysmal atrial fibrillation: Secondary | ICD-10-CM

## 2011-04-15 DIAGNOSIS — I4891 Unspecified atrial fibrillation: Secondary | ICD-10-CM

## 2011-05-07 ENCOUNTER — Other Ambulatory Visit: Payer: Self-pay | Admitting: Cardiology

## 2011-05-07 NOTE — Telephone Encounter (Signed)
Refilled potassium

## 2011-05-15 ENCOUNTER — Ambulatory Visit (INDEPENDENT_AMBULATORY_CARE_PROVIDER_SITE_OTHER): Payer: Medicare Other | Admitting: *Deleted

## 2011-05-15 DIAGNOSIS — I480 Paroxysmal atrial fibrillation: Secondary | ICD-10-CM

## 2011-05-15 DIAGNOSIS — I4891 Unspecified atrial fibrillation: Secondary | ICD-10-CM

## 2011-06-04 ENCOUNTER — Ambulatory Visit (INDEPENDENT_AMBULATORY_CARE_PROVIDER_SITE_OTHER): Payer: Medicare Other | Admitting: *Deleted

## 2011-06-04 DIAGNOSIS — I480 Paroxysmal atrial fibrillation: Secondary | ICD-10-CM

## 2011-06-04 DIAGNOSIS — I4891 Unspecified atrial fibrillation: Secondary | ICD-10-CM

## 2011-06-04 LAB — POCT INR: INR: 2.5

## 2011-06-15 ENCOUNTER — Other Ambulatory Visit: Payer: Self-pay | Admitting: Cardiology

## 2011-06-22 ENCOUNTER — Encounter: Payer: Self-pay | Admitting: Cardiology

## 2011-06-22 ENCOUNTER — Ambulatory Visit (INDEPENDENT_AMBULATORY_CARE_PROVIDER_SITE_OTHER): Payer: Medicare Other | Admitting: *Deleted

## 2011-06-22 ENCOUNTER — Ambulatory Visit (INDEPENDENT_AMBULATORY_CARE_PROVIDER_SITE_OTHER): Payer: Medicare Other | Admitting: Cardiology

## 2011-06-22 VITALS — BP 130/80 | HR 63 | Ht 68.0 in | Wt 202.0 lb

## 2011-06-22 DIAGNOSIS — J449 Chronic obstructive pulmonary disease, unspecified: Secondary | ICD-10-CM

## 2011-06-22 DIAGNOSIS — E785 Hyperlipidemia, unspecified: Secondary | ICD-10-CM

## 2011-06-22 DIAGNOSIS — I482 Chronic atrial fibrillation, unspecified: Secondary | ICD-10-CM

## 2011-06-22 DIAGNOSIS — K219 Gastro-esophageal reflux disease without esophagitis: Secondary | ICD-10-CM

## 2011-06-22 DIAGNOSIS — I4891 Unspecified atrial fibrillation: Secondary | ICD-10-CM

## 2011-06-22 DIAGNOSIS — I480 Paroxysmal atrial fibrillation: Secondary | ICD-10-CM

## 2011-06-22 DIAGNOSIS — I2510 Atherosclerotic heart disease of native coronary artery without angina pectoris: Secondary | ICD-10-CM

## 2011-06-22 LAB — POCT INR: INR: 1.5

## 2011-06-22 NOTE — Assessment & Plan Note (Signed)
The patient denies any recent chest pain to suggest angina pectoris.  The patient is relatively sedentary.  He does not have a regular exercise regimen.

## 2011-06-22 NOTE — Progress Notes (Signed)
Scott Reese Date of Birth:  Oct 27, 1927 [facility] [address] [place], [place]  [address] [phone]         Fax   [phone]  History of Present Illness: This pleasant 78 year old gentleman is seen for a three-month followup office visit.  Has a history of known ischemic heart disease.  He has stable exertional angina pectoris.  He is a former smoker and does have COPD.  He has moderate cardiomegaly but no active symptoms of heart failure at this time.  His last nuclear stress test in 2011 showed normal perfusion and his ejection fraction was 61%.  A previous echocardiogram in 2005 had shown moderate mitral regurgitation.  He has been in chronic atrial fib and is on long-term Coumadin.  Current Outpatient Prescriptions  Medication Sig Dispense Refill  . allopurinol (ZYLOPRIM) 300 MG tablet TAKE 1 TABLET BY MOUTH EVERY DAY  90 tablet  3  . atorvastatin (LIPITOR) 10 MG tablet Take 1 tablet (10 mg total) by mouth daily.  30 tablet  11  . furosemide (LASIX) 40 MG tablet Take 40 mg by mouth daily. 2 tablets daily       . HYDROcodone-acetaminophen (NORCO) 5-325 MG per tablet Take 1 tablet by mouth daily as needed.        . metoprolol (LOPRESSOR) 100 MG tablet Take 100 mg by mouth as directed. Take 1/2 tablet twice daily        . nitroGLYCERIN (NITROSTAT) 0.4 MG SL tablet Place 0.4 mg under the tongue every 5 (five) minutes as needed.        Marland Kitchen omeprazole (PRILOSEC) 40 MG capsule Take 1 capsule (40 mg total) by mouth daily.  30 capsule  11  . potassium chloride SA (K-DUR,KLOR-CON) 20 MEQ tablet TAKE 1 TABLET BY MOUTH EVERY DAY  30 tablet  PRN  . warfarin (COUMADIN) 5 MG tablet Take 1 tablet (5 mg total) by mouth as directed.  40 tablet  3    No Known Allergies  Patient Active Problem List  Diagnoses  . Coronary artery disease  . Dyspnea  . COPD (chronic obstructive pulmonary disease)  . Paroxysmal atrial fibrillation  . CHF (congestive heart failure)  .  Hyperlipidemia  . Dyspepsia  . Chronic atrial fibrillation  . Dyslipidemia  . Dizziness    History  Smoking status  . Former Smoker  . Types: Cigarettes  . Quit date: 03/09/1989  Smokeless tobacco  . Not on file    History  Alcohol Use: Not on file    Family History  Problem Relation Age of Onset  . Diabetes Father   . Stroke Brother   . Cancer Daughter     Review of Systems: Constitutional: no fever chills diaphoresis or fatigue or change in weight.  Head and neck: no hearing loss, no epistaxis, no photophobia or visual disturbance. Respiratory: No cough, shortness of breath or wheezing. Cardiovascular: No chest pain peripheral edema, palpitations. Gastrointestinal: No abdominal distention, no abdominal pain, no change in bowel habits hematochezia or melena. Genitourinary: No dysuria, no frequency, no urgency, no nocturia. Musculoskeletal:No arthralgias, no back pain, no gait disturbance or myalgias. Neurological: No dizziness, no headaches, no numbness, no seizures, no syncope, no weakness, no tremors. Hematologic: No lymphadenopathy, no easy bruising. Psychiatric: No confusion, no hallucinations, no sleep disturbance.    Physical Exam: Filed Vitals:   06/22/11 1046  BP: 130/80  Pulse: 63   the general appearance reveals a well-developed well-nourished elderly male in no distress.Pupils  equal and reactive.   Extraocular Movements are full.  There is no scleral icterus.  The mouth and pharynx are normal.  The neck is supple.  The carotids reveal no bruits.  The jugular venous pressure is normal.  The thyroid is not enlarged.  There is no lymphadenopathy.  Chest reveals good aeration bilaterally without rhonchiThe precordium is quiet.  The first heart sound is normal.  The second heart sound is physiologically split.  There is no murmur gallop rub or click.  There is no abnormal lift or heave.  Rhythm is irregular in atrial fibrillation. The abdomen is soft and  nontender. Bowel sounds are normal. The liver and spleen are not enlarged. There Are no abdominal masses. There are no bruits.  The pedal pulses are good.  There is no phlebitis or edema.  There is no cyanosis or clubbing. Strength is normal and symmetrical in all extremities.  There is no lateralizing weakness.  There are no sensory deficits.  The skin is warm and dry.  There is no rash.  EKG today shows atrial fibrillation with a controlled ventricular response.  He has a pattern of bifascicular block.  There are nonspecific inferolateral ST-T wave changes.     Assessment / Plan: The patient is to continue same medication.  I encouraged her to try to increase moderate exercise.  Recheck in 3 months for followup office visit CBC and fasting lipid panel hepatic function panel and basal metabolic panel

## 2011-06-22 NOTE — Assessment & Plan Note (Signed)
Patient has a history of dyslipidemia.  He is on low-dose Lipitor 10 mg daily.  He is tolerating well.  We will check fasting lab work visit.

## 2011-06-22 NOTE — Assessment & Plan Note (Signed)
The patient is on long-term Coumadin monitored through the Coumadin clinic.  The patient is compliant with his medication.  He has not had any TIA symptoms.

## 2011-06-22 NOTE — Patient Instructions (Signed)
Your physician recommends that you schedule a follow-up appointment in: 3 months with Dr. Patty Sermons.  Your physician recommends that you return for fasting lab work in: 3 months.

## 2011-06-28 ENCOUNTER — Inpatient Hospital Stay (HOSPITAL_COMMUNITY)
Admission: EM | Admit: 2011-06-28 | Discharge: 2011-07-03 | DRG: 291 | Disposition: A | Discharge status: Home / Self Care | Payer: Medicare Other | Attending: Internal Medicine | Admitting: Internal Medicine

## 2011-06-28 ENCOUNTER — Encounter (HOSPITAL_COMMUNITY): Payer: Self-pay | Admitting: *Deleted

## 2011-06-28 ENCOUNTER — Emergency Department (HOSPITAL_COMMUNITY): Payer: Medicare Other

## 2011-06-28 DIAGNOSIS — F10231 Alcohol dependence with withdrawal delirium: Secondary | ICD-10-CM | POA: Diagnosis present

## 2011-06-28 DIAGNOSIS — IMO0002 Reserved for concepts with insufficient information to code with codable children: Secondary | ICD-10-CM

## 2011-06-28 DIAGNOSIS — I5033 Acute on chronic diastolic (congestive) heart failure: Principal | ICD-10-CM | POA: Diagnosis present

## 2011-06-28 DIAGNOSIS — Z9861 Coronary angioplasty status: Secondary | ICD-10-CM

## 2011-06-28 DIAGNOSIS — R079 Chest pain, unspecified: Secondary | ICD-10-CM

## 2011-06-28 DIAGNOSIS — I4510 Unspecified right bundle-branch block: Secondary | ICD-10-CM | POA: Diagnosis present

## 2011-06-28 DIAGNOSIS — R0902 Hypoxemia: Secondary | ICD-10-CM | POA: Diagnosis present

## 2011-06-28 DIAGNOSIS — J441 Chronic obstructive pulmonary disease with (acute) exacerbation: Secondary | ICD-10-CM | POA: Diagnosis present

## 2011-06-28 DIAGNOSIS — R05 Cough: Secondary | ICD-10-CM

## 2011-06-28 DIAGNOSIS — I482 Chronic atrial fibrillation, unspecified: Secondary | ICD-10-CM | POA: Insufficient documentation

## 2011-06-28 DIAGNOSIS — I509 Heart failure, unspecified: Secondary | ICD-10-CM | POA: Diagnosis present

## 2011-06-28 DIAGNOSIS — I2510 Atherosclerotic heart disease of native coronary artery without angina pectoris: Secondary | ICD-10-CM | POA: Diagnosis present

## 2011-06-28 DIAGNOSIS — Z7982 Long term (current) use of aspirin: Secondary | ICD-10-CM

## 2011-06-28 DIAGNOSIS — I10 Essential (primary) hypertension: Secondary | ICD-10-CM | POA: Diagnosis present

## 2011-06-28 DIAGNOSIS — F10931 Alcohol use, unspecified with withdrawal delirium: Secondary | ICD-10-CM | POA: Diagnosis present

## 2011-06-28 DIAGNOSIS — J9600 Acute respiratory failure, unspecified whether with hypoxia or hypercapnia: Secondary | ICD-10-CM | POA: Diagnosis present

## 2011-06-28 DIAGNOSIS — I080 Rheumatic disorders of both mitral and aortic valves: Secondary | ICD-10-CM | POA: Diagnosis present

## 2011-06-28 DIAGNOSIS — R0600 Dyspnea, unspecified: Secondary | ICD-10-CM | POA: Insufficient documentation

## 2011-06-28 DIAGNOSIS — J9819 Other pulmonary collapse: Secondary | ICD-10-CM | POA: Diagnosis present

## 2011-06-28 DIAGNOSIS — Z7901 Long term (current) use of anticoagulants: Secondary | ICD-10-CM

## 2011-06-28 DIAGNOSIS — Z87891 Personal history of nicotine dependence: Secondary | ICD-10-CM

## 2011-06-28 DIAGNOSIS — K219 Gastro-esophageal reflux disease without esophagitis: Secondary | ICD-10-CM | POA: Diagnosis present

## 2011-06-28 DIAGNOSIS — I4891 Unspecified atrial fibrillation: Secondary | ICD-10-CM | POA: Diagnosis present

## 2011-06-28 DIAGNOSIS — F1020 Alcohol dependence, uncomplicated: Secondary | ICD-10-CM | POA: Diagnosis present

## 2011-06-28 DIAGNOSIS — I079 Rheumatic tricuspid valve disease, unspecified: Secondary | ICD-10-CM | POA: Diagnosis present

## 2011-06-28 DIAGNOSIS — E785 Hyperlipidemia, unspecified: Secondary | ICD-10-CM

## 2011-06-28 DIAGNOSIS — Z79899 Other long term (current) drug therapy: Secondary | ICD-10-CM

## 2011-06-28 HISTORY — DX: Essential (primary) hypertension: I10

## 2011-06-28 HISTORY — DX: Unspecified atrial fibrillation: I48.91

## 2011-06-28 HISTORY — DX: Unspecified diastolic (congestive) heart failure: I50.30

## 2011-06-28 HISTORY — DX: Gastro-esophageal reflux disease without esophagitis: K21.9

## 2011-06-28 LAB — BASIC METABOLIC PANEL
BUN: 25 mg/dL — ABNORMAL HIGH (ref 6–23)
CO2: 32 mEq/L (ref 19–32)
Calcium: 9.5 mg/dL (ref 8.4–10.5)
Creatinine, Ser: 1.38 mg/dL — ABNORMAL HIGH (ref 0.50–1.35)
Glucose, Bld: 103 mg/dL — ABNORMAL HIGH (ref 70–99)

## 2011-06-28 LAB — CBC
HCT: 46.1 % (ref 39.0–52.0)
MCV: 92.6 fL (ref 78.0–100.0)
RBC: 4.98 MIL/uL (ref 4.22–5.81)
WBC: 6.7 10*3/uL (ref 4.0–10.5)

## 2011-06-28 LAB — DIFFERENTIAL
Eosinophils Relative: 0 % (ref 0–5)
Lymphocytes Relative: 15 % (ref 12–46)
Lymphs Abs: 1 10*3/uL (ref 0.7–4.0)
Monocytes Absolute: 0.8 10*3/uL (ref 0.1–1.0)

## 2011-06-28 MED ORDER — DILTIAZEM LOAD VIA INFUSION
15.0000 mg | Freq: Once | INTRAVENOUS | Status: AC
  Administered 2011-06-28: 15 mg via INTRAVENOUS
  Filled 2011-06-28: qty 15

## 2011-06-28 MED ORDER — WARFARIN - PHYSICIAN DOSING INPATIENT: Freq: Every day | Status: DC

## 2011-06-28 MED ORDER — IPRATROPIUM BROMIDE 0.02 % IN SOLN
0.5000 mg | Freq: Once | RESPIRATORY_TRACT | Status: AC
  Administered 2011-06-28: 0.5 mg via RESPIRATORY_TRACT
  Filled 2011-06-28: qty 2.5

## 2011-06-28 MED ORDER — METOPROLOL TARTRATE 50 MG PO TABS
50.0000 mg | ORAL_TABLET | Freq: Two times a day (BID) | ORAL | Status: DC
  Administered 2011-06-28 – 2011-07-03 (×10): 50 mg via ORAL
  Filled 2011-06-28: qty 2
  Filled 2011-06-28 (×10): qty 1

## 2011-06-28 MED ORDER — ATORVASTATIN CALCIUM 10 MG PO TABS: 10.0000 mg | ORAL_TABLET | Freq: Every day | ORAL | Status: DC

## 2011-06-28 MED ORDER — ALLOPURINOL 300 MG PO TABS
300.0000 mg | ORAL_TABLET | Freq: Every day | ORAL | Status: DC
  Administered 2011-06-28 – 2011-07-03 (×6): 300 mg via ORAL
  Filled 2011-06-28 (×7): qty 1

## 2011-06-28 MED ORDER — PANTOPRAZOLE SODIUM 40 MG PO TBEC
40.0000 mg | DELAYED_RELEASE_TABLET | Freq: Every day | ORAL | Status: DC
  Administered 2011-06-28 – 2011-06-29 (×2): 40 mg via ORAL
  Filled 2011-06-28 (×2): qty 1

## 2011-06-28 MED ORDER — HYDROCODONE-ACETAMINOPHEN 5-325 MG PO TABS
1.0000 | ORAL_TABLET | Freq: Every day | ORAL | Status: DC | PRN
  Administered 2011-06-28: 1 via ORAL
  Filled 2011-06-28 (×2): qty 1

## 2011-06-28 MED ORDER — ALBUTEROL SULFATE (5 MG/ML) 0.5% IN NEBU
5.0000 mg | INHALATION_SOLUTION | Freq: Once | RESPIRATORY_TRACT | Status: AC
  Administered 2011-06-28: 5 mg via RESPIRATORY_TRACT
  Filled 2011-06-28: qty 1

## 2011-06-28 MED ORDER — FUROSEMIDE 10 MG/ML IJ SOLN
80.0000 mg | Freq: Once | INTRAMUSCULAR | Status: AC
  Administered 2011-06-28: 80 mg via INTRAVENOUS
  Filled 2011-06-28 (×2): qty 4

## 2011-06-28 MED ORDER — DILTIAZEM HCL 100 MG IV SOLR
5.0000 mg/h | INTRAVENOUS | Status: DC
  Administered 2011-06-28: 5 mg/h via INTRAVENOUS
  Filled 2011-06-28: qty 100

## 2011-06-28 MED ORDER — METHYLPREDNISOLONE SODIUM SUCC 125 MG IJ SOLR
125.0000 mg | Freq: Once | INTRAMUSCULAR | Status: AC
  Administered 2011-06-28: 125 mg via INTRAVENOUS
  Filled 2011-06-28: qty 2

## 2011-06-28 MED ORDER — ALBUTEROL SULFATE (5 MG/ML) 0.5% IN NEBU
2.5000 mg | INHALATION_SOLUTION | Freq: Once | RESPIRATORY_TRACT | Status: AC
  Administered 2011-06-28: 2.5 mg via RESPIRATORY_TRACT

## 2011-06-28 MED ORDER — WARFARIN SODIUM 5 MG PO TABS: 5.0000 mg | ORAL_TABLET | ORAL | Status: DC

## 2011-06-28 MED ORDER — IPRATROPIUM BROMIDE 0.02 % IN SOLN
0.5000 mg | Freq: Once | RESPIRATORY_TRACT | Status: AC
  Administered 2011-06-28: 0.5 mg via RESPIRATORY_TRACT

## 2011-06-28 MED ORDER — ATORVASTATIN CALCIUM 10 MG PO TABS
10.0000 mg | ORAL_TABLET | Freq: Every day | ORAL | Status: DC
  Administered 2011-06-29 – 2011-07-02 (×4): 10 mg via ORAL
  Filled 2011-06-28 (×6): qty 1

## 2011-06-28 MED ORDER — NITROGLYCERIN 2 % TD OINT
1.0000 [in_us] | TOPICAL_OINTMENT | Freq: Four times a day (QID) | TRANSDERMAL | Status: DC
  Administered 2011-06-28 – 2011-07-03 (×17): 1 [in_us] via TOPICAL
  Filled 2011-06-28 (×6): qty 30
  Filled 2011-06-28: qty 1
  Filled 2011-06-28 (×23): qty 30

## 2011-06-28 MED ORDER — ALBUTEROL SULFATE (5 MG/ML) 0.5% IN NEBU
INHALATION_SOLUTION | RESPIRATORY_TRACT | Status: AC
  Filled 2011-06-28: qty 1

## 2011-06-28 MED ORDER — WARFARIN SODIUM 5 MG PO TABS
5.0000 mg | ORAL_TABLET | ORAL | Status: AC
Start: 2011-06-29 — End: 2011-06-29
  Administered 2011-06-29: 5 mg via ORAL
  Filled 2011-06-28 (×2): qty 1

## 2011-06-28 MED ORDER — IPRATROPIUM BROMIDE 0.02 % IN SOLN
RESPIRATORY_TRACT | Status: AC
  Filled 2011-06-28: qty 2.5

## 2011-06-28 MED ORDER — WARFARIN SODIUM 2.5 MG PO TABS
2.5000 mg | ORAL_TABLET | ORAL | Status: DC
  Administered 2011-06-28: 2.5 mg via ORAL
  Filled 2011-06-28: qty 1

## 2011-06-28 MED ORDER — WARFARIN SODIUM 2.5 MG PO TABS: 2.5000 mg | ORAL_TABLET | Freq: Every day | ORAL | Status: DC

## 2011-06-28 NOTE — ED Notes (Signed)
X-Ray called for Test

## 2011-06-28 NOTE — ED Provider Notes (Signed)
Date: 06/28/2011  Rate: 113  Rhythm: atrial fibrillation  QRS Axis: left  Intervals: normal  ST/T Wave abnormalities: normal  Conduction Disutrbances:right bundle branch block and left anterior fascicular block  Narrative Interpretation: Abnormal EKG  Old EKG Reviewed: changes noted--has gone into atrial fibrillation since tracing of 06/19/1999.    Carleene Cooper III, MD 06/28/11 [phone]

## 2011-06-28 NOTE — H&P (Signed)
NAMEALEXUS, Scott Reese NO.:  [id number]  MEDICAL RECORD NO.:  [id number]  LOCATION:  [id number]                        FACILITY:  [facility]  PHYSICIAN:  Natasha Bence, MD       DATE OF BIRTH:  1927/05/09  DATE OF ADMISSION:  06/28/2011 DATE OF DISCHARGE:                             HISTORY & PHYSICAL   PRIMARY CARDIOLOGIST:  Cassell Clement, MD  CHIEF COMPLAINT:  Shortness of breath.  HISTORY OF PRESENT ILLNESS:  Scott Reese is a 78 year old white male with a history of chronic atrial fibrillation, who presents with worsening dyspnea on exertion and shortness of breath over the last 2-3 weeks.  Scott Reese reports it has significantly worsened over the last 2-3 days.  Scott Reese complains of shortness of breath, worse while lying down.  Scott Reese also has a cough that is worse with lying down.  It is nonproductive cough.  Scott Reese denies any chest discomfort.  Scott Reese does have increased palpitations.  Scott Reese denies any lightheadedness currently but Scott Reese has experienced occasional lightheadedness over the past year.  Scott Reese denies any fevers, chills, or sweats.  Scott Reese has not had any sick contacts.  Scott Reese complains of nightly PND or orthopnea, which has been ongoing for a year but has been worsening. Scott Reese says Scott Reese weight is fairly stable but Scott Reese feels like Scott Reese has gained fluid.  Scott Reese does have some mild lower extremity edema that Scott Reese complains of.  Scott Reese abdominal girth is reportedly unchanged.  Scott Reese denies any bleeding or melena.  In the ER, Scott Reese was given several nebulizer treatments without any relief, otherwise complete.  REVIEW OF SYSTEMS:  Performed was negative except for what was stated above.  PAST MEDICAL HISTORY: 1. Coronary artery disease. 2. Chronic atrial fibrillation. 3. Chronic obstructive pulmonary disease. 4. Dyslipidemia. 5. GERD.  FAMILY HISTORY:  Father had diabetes.  SOCIAL HISTORY:  Scott Reese is a former heavy smoker.  Quit approximately 20 years ago.  Drinks about a fifth of liquor a week.  ALLERGIES:  Scott Reese  has no known drug allergies.  HOME MEDICATIONS: 1. Allopurinol 300 mg p.o. daily. 2. Lipitor 10 mg p.o. daily. 3. Lasix 40 mg p.o. b.i.d. 4. Lortab p.r.n. 5. Metoprolol 50 mg p.o. b.i.d. 6. Nitroglycerin p.r.n. 7. Prilosec 40 mg p.o. daily. 8. K-Dur 20 mEq p.o. daily. 9. Coumadin 2.5-5 mg p.o. daily.  Scott Reese takes 5 mg every day except     Saturdays and Sundays, Scott Reese takes 2.5.  PHYSICAL EXAMINATION:  VITAL SIGNS:  Scott Reese is afebrile with temperature 98.6, pulse 73 and regular, respiratory rate is 25, blood pressure 183/81, O2 sats 91%. GENERAL:  Scott Reese is a well-developed, elderly white male, in no apparent distress. EYES:  Scott Reese has anicteric sclerae. NECK:  Scott Reese has increased jugular venous pressure.  No carotid bruits. LUNGS:  Decreased breath sounds at the left base, otherwise Scott Reese has faint expiratory wheezes bilaterally and crackles in Scott Reese left base. CARDIOVASCULAR:  Scott Reese is tachycardic and irregular with a rate in the 130s.  No murmurs auscultated.  No rub or gallops. HEART:  Sounds fairly distant. ABDOMEN:  Soft, nontender.  No ascites.  EXTREMITIES:  Warm.  Scott Reese has edema to Scott Reese mid  shins bilaterally. NEURO:  Grossly afocal. SKIN:  No rash or ulcers.  LABORATORY DATA:  Sodium 142, potassium 3.9, chloride of 100, bicarb of 32, BUN of 25, creatinine of 1.38, glucose 103.  Troponin less than 0.3, proBNP is 2997.  White blood cell count is 6.7 with a normal differential.  Hemoglobin was 15, platelet count was 121.  PT was 27. INR was 2.45.  Chest x-ray was a poor film, which showed left lower lobe scarring and bibasilar atelectasis.  EKG shows atrial fibrillation with rapid ventricular rate of 113 beats per minute.  Scott Reese has a right bundle- branch block.  IMPRESSION AND PLAN:  This is an 78 year old, white male, who presents with acute heart failure symptoms.  Scott Reese is also profoundly hypertensive and has atrial fibrillation with rapid ventricular rate.  Scott Reese reports poor compliance with most of Scott Reese  medicine over the past week.  Scott Reese has not taken any of Scott Reese medicines today, which is likely contributory.  Scott Reese does also have chronic obstructive pulmonary disease, we feel this is more likely heart failure than chronic obstructive pulmonary disease exacerbation.  We will empirically treat Scott Reese as such.  We will initiate diltiazem infusion to control Scott Reese rate and also help with Scott Reese blood pressure.  We have also  just given Scott Reese Scott Reese first dose of IV Lasix in the ER.  We will see how Scott Reese diuresis.  Scott Reese is currently anticoagulated on Coumadin adequately with an INR of 2.45.  We will also add an ACE inhibitor to Scott Reese regimen.  At this point, lisinopril 10 mg p.o. daily. Of note, Scott Reese has not had an echocardiogram since 2005.  Scott Reese had normal LV systolic function at that point in time.  However, Scott Reese did have moderate mitral regurgitation.  We will repeat an echocardiogram tomorrow to evaluate Scott Reese LV systolic function and Scott Reese mitral regurgitation, which could be contributory, continue IV diuresis and blood pressure control. We will try to get Scott Reese off the IV diltiazem back on Scott Reese standard metoprolol regimen depending on what Scott Reese LV function shows.  This might need to be switched to a different beta-blocker.  We will monitor Scott Reese renal function and Scott Reese electrolytes with diuresis.          ______________________________ Natasha Bence, MD     MH/MEDQ  D:  06/28/2011  T:  06/28/2011  Job:  [id number]

## 2011-06-28 NOTE — ED Notes (Signed)
Call Sampson Goon ( Grandson of PT) when he gets a room assigment. Cell- [phone], Home [phone] ... Wife Cell [phone]  , Home [phone]

## 2011-06-28 NOTE — ED Notes (Signed)
PT reports cough and conjestion for 3 weeks. Felt worse over the last 3days.

## 2011-06-28 NOTE — ED Provider Notes (Signed)
History     CSN: [id number]  Arrival date & time 06/28/11  1843   First MD Initiated Contact with Patient 06/28/11 1903      Chief Complaint  Patient presents with  . Cough    (Consider location/radiation/quality/duration/timing/severity/associated sxs/prior treatment) HPI  78 year old male with history of COPD, CHF, and proximal atrial fibrillation presents with a chief complaints of cough. Patient states for the past week he has been expressing gradual onset of cough and chest congestion. He felt that he has sputum in his chest but unable to have a productive cough. He is having increased shortness of breath with exertion. He also has subjective fever and chills. Patient denies headache, chest pain, nausea, vomiting and, abdominal pain. States he did have a mild diarrhea for several days ago but that has resolved. He denies any increase weight gain. Denies any recent hospitalization, prolonged bed rest, calf pain, or increased leg swelling.  He has been taken all of his medication as prescribed. Has been following up with his Coumadin clinic. Denies any recent sick contact. Patient is a former smoker but has not smoked in more than 20 years.  Past Medical History  Diagnosis Date  . Coronary artery disease   . Dyspnea   . COPD (chronic obstructive pulmonary disease)   . Paroxysmal atrial fibrillation   . Hyperlipidemia   . Dyspepsia   . CHF (congestive heart failure)     Past Surgical History  Procedure Date  . Cardiac catheterization 2004    EF 50%/L circumflex artery very lg vessel/proximal vessel is approx. 57mm/40-50% eccentric stenosis proximal aspect of vessel  . Transesophageal echocardiogram 06/18/99    no evidence of any clot in the L atruim or L atrial appendage & no mitral regurgiation/small central jet of aortic insufficiency  . Cardioversion 06/18/99    electrical atrical/ converted after single 200 watt-sec shock with AP paddles/ tolerated procedure well     Family History  Problem Relation Age of Onset  . Diabetes Father   . Stroke Brother   . Cancer Daughter     History  Substance Use Topics  . Smoking status: Former Smoker    Types: Cigarettes    Quit date: 03/09/1989  . Smokeless tobacco: Not on file  . Alcohol Use: Not on file      Review of Systems  All other systems reviewed and are negative.    Allergies  Review of patient's allergies indicates no known allergies.  Home Medications   Current Outpatient Rx  Name Route Sig Dispense Refill  . ALLOPURINOL 300 MG PO TABS Oral Take 300 mg by mouth daily.    Marland Kitchen POTASSIUM CHLORIDE CRYS ER 20 MEQ PO TBCR Oral Take 20 mEq by mouth daily.    . ATORVASTATIN CALCIUM 10 MG PO TABS Oral Take 10 mg by mouth daily.    . FUROSEMIDE 40 MG PO TABS Oral Take 40 mg by mouth 2 (two) times daily.     Marland Kitchen HYDROCODONE-ACETAMINOPHEN 5-325 MG PO TABS Oral Take 1 tablet by mouth daily as needed. For pain.    Marland Kitchen METOPROLOL TARTRATE 100 MG PO TABS Oral Take 50 mg by mouth 2 (two) times daily.     Marland Kitchen NITROGLYCERIN 0.4 MG SL SUBL Sublingual Place 0.4 mg under the tongue every 5 (five) minutes as needed. For chest pain.    Marland Kitchen OMEPRAZOLE 40 MG PO CPDR Oral Take 40 mg by mouth daily.    . WARFARIN SODIUM 5 MG PO TABS Oral  Take 5 mg by mouth daily.      BP 184/85  Pulse 64  Temp(Src) 98.6 F (37 C) (Oral)  Resp 31  SpO2 100%  Physical Exam  Nursing note and vitals reviewed. Constitutional: He appears well-developed and well-nourished. No distress.       Awake, alert, nontoxic appearance  HENT:  Head: Atraumatic.  Right Ear: External ear normal.  Left Ear: External ear normal.  Mouth/Throat: Oropharynx is clear and moist. No oropharyngeal exudate.  Eyes: Conjunctivae are normal. Right eye exhibits no discharge. Left eye exhibits no discharge.  Neck: Normal range of motion. Neck supple.  Cardiovascular: An irregularly irregular rhythm present.  Pulmonary/Chest: Effort normal. No respiratory  distress. He has decreased breath sounds. He has wheezes. He has rhonchi. He has rales. He exhibits no tenderness.       Scattered wheezes noted throughout all lung fields. Mild rhonchi noted to upper lung, rales noted to bilateral lower lungs. No accessory muscle use.  Abdominal: Soft. There is no tenderness. There is no rebound.  Musculoskeletal: He exhibits edema. He exhibits no tenderness.       ROM appears intact, no obvious focal weakness. Trace edema noted to bilateral lower extremities. Evidence of onychomycosis to all toes. Pedal pulse palpable bilaterally with brisk capillary feels  Lymphadenopathy:    He has no cervical adenopathy.  Neurological: He is alert.  Skin: Skin is warm and dry. No rash noted.  Psychiatric: He has a normal mood and affect.    ED Course  Procedures (including critical care time)  Labs Reviewed - No data to display No results found.   No diagnosis found.  Results for orders placed during the hospital encounter of 06/28/11  CBC      Component Value Range   WBC 6.7  4.0 - 10.5 (K/uL)   RBC 4.98  4.22 - 5.81 (MIL/uL)   Hemoglobin 15.0  13.0 - 17.0 (g/dL)   HCT 02.7  25.3 - 66.4 (%)   MCV 92.6  78.0 - 100.0 (fL)   MCH 30.1  26.0 - 34.0 (pg)   MCHC 32.5  30.0 - 36.0 (g/dL)   RDW 40.3 (*) 47.4 - 15.5 (%)   Platelets 121 (*) 150 - 400 (K/uL)  DIFFERENTIAL      Component Value Range   Neutrophils Relative 73  43 - 77 (%)   Neutro Abs 4.9  1.7 - 7.7 (K/uL)   Lymphocytes Relative 15  12 - 46 (%)   Lymphs Abs 1.0  0.7 - 4.0 (K/uL)   Monocytes Relative 12  3 - 12 (%)   Monocytes Absolute 0.8  0.1 - 1.0 (K/uL)   Eosinophils Relative 0  0 - 5 (%)   Eosinophils Absolute 0.0  0.0 - 0.7 (K/uL)   Basophils Relative 0  0 - 1 (%)   Basophils Absolute 0.0  0.0 - 0.1 (K/uL)  BASIC METABOLIC PANEL      Component Value Range   Sodium 142  135 - 145 (mEq/L)   Potassium 3.9  3.5 - 5.1 (mEq/L)   Chloride 100  96 - 112 (mEq/L)   CO2 32  19 - 32 (mEq/L)    Glucose, Bld 103 (*) 70 - 99 (mg/dL)   BUN 25 (*) 6 - 23 (mg/dL)   Creatinine, Ser 2.59 (*) 0.50 - 1.35 (mg/dL)   Calcium 9.5  8.4 - 56.3 (mg/dL)   GFR calc non Af Amer 46 (*) >90 (mL/min)   GFR calc Af Amer 54 (*) >  90 (mL/min)  PRO B NATRIURETIC PEPTIDE      Component Value Range   Pro B Natriuretic peptide (BNP) 2997.0 (*) 0 - 450 (pg/mL)  PROTIME-INR      Component Value Range   Prothrombin Time 27.0 (*) 11.6 - 15.2 (seconds)   INR 2.45 (*) 0.00 - 1.49   TROPONIN I      Component Value Range   Troponin I <0.30  <0.30 (ng/mL)   Dg Chest 2 View  06/28/2011  *RADIOLOGY REPORT*  Clinical Data: Cough and congestion, shortness of breath  CHEST - 2 VIEW  Comparison: 12/16/2010  Findings: Cardiac leads obscure detail.  Moderate enlargement of the cardiomediastinal silhouette is present of the central vascular congestion but no overt edema.  Left lower lobe scarring is stable. Apparent retrocardiac opacity on the frontal view is likely due to hypoaeration and summation of shadows due to positioning, based on review of the lateral image.  No pneumothorax.  No acute osseous finding. The spine is not well evaluated on the lateral projection due to habitus and technique.  IMPRESSION: Left lower lobe scarring again noted.  Low volumes with bibasilar atelectasis but no focal acute finding otherwise. If the patient's symptoms continue, consider PA and lateral chest radiographs obtained at full inspiration when the patient is clinically able.  Original Report Authenticated By: Harrel Lemon, M.D.      MDM  Wheezes/Rales/Rhonchi noted on lung exam.  However, pt is able to speak in complete sentences, and O2 @ 100 on 2L.  Plan to obtain CXR, BNP, basic labs, ECG, troponin I.  Breathing treatment and steroid given.  Low suspicion for PE.  Discussed care with my attending.    8:31 PM Pt desats to low 90s after having a bowel movement and after exertion.  Oxygenation improves after rest.  Plan to have pt  admitted for COPD exacerbation.     9:46 PM Pt has elevated BNP with CXR shows vascular congestions. Pt continues to endorse sob despite several breathing treatment.  Lasix 80mg  IV and intro paste given for CHF.  Pt will be admitted for CHF exacerbation.  My attending has called for consult, pt agrees with plan.     Fayrene Helper, PA-C 06/28/11 2147

## 2011-06-28 NOTE — ED Notes (Signed)
PT 197.4 pounds

## 2011-06-29 ENCOUNTER — Encounter (HOSPITAL_COMMUNITY): Payer: Self-pay | Admitting: Nurse Practitioner

## 2011-06-29 DIAGNOSIS — I369 Nonrheumatic tricuspid valve disorder, unspecified: Secondary | ICD-10-CM

## 2011-06-29 DIAGNOSIS — I4891 Unspecified atrial fibrillation: Secondary | ICD-10-CM

## 2011-06-29 LAB — TSH: TSH: 0.288 u[IU]/mL — ABNORMAL LOW (ref 0.350–4.500)

## 2011-06-29 LAB — PROTIME-INR: INR: 2.41 — ABNORMAL HIGH (ref 0.00–1.49)

## 2011-06-29 MED ORDER — SODIUM CHLORIDE 0.9 % IV SOLN
250.0000 mL | INTRAVENOUS | Status: DC | PRN
  Administered 2011-07-02: 250 mL via INTRAVENOUS

## 2011-06-29 MED ORDER — SODIUM CHLORIDE 0.9 % IJ SOLN
3.0000 mL | Freq: Two times a day (BID) | INTRAMUSCULAR | Status: DC
  Administered 2011-06-29 – 2011-07-03 (×9): 3 mL via INTRAVENOUS

## 2011-06-29 MED ORDER — BIOTENE DRY MOUTH MT LIQD
15.0000 mL | Freq: Two times a day (BID) | OROMUCOSAL | Status: DC
  Administered 2011-06-29 – 2011-07-03 (×7): 15 mL via OROMUCOSAL

## 2011-06-29 MED ORDER — ONDANSETRON HCL 4 MG/2ML IJ SOLN: 4.0000 mg | Freq: Four times a day (QID) | INTRAMUSCULAR | Status: DC | PRN

## 2011-06-29 MED ORDER — SODIUM CHLORIDE 0.9 % IJ SOLN: 3.0000 mL | INTRAMUSCULAR | Status: DC | PRN

## 2011-06-29 MED ORDER — LEVALBUTEROL HCL 0.63 MG/3ML IN NEBU
0.6300 mg | INHALATION_SOLUTION | Freq: Four times a day (QID) | RESPIRATORY_TRACT | Status: DC | PRN
  Administered 2011-07-01: 0.63 mg via RESPIRATORY_TRACT
  Filled 2011-06-29: qty 3

## 2011-06-29 MED ORDER — LEVALBUTEROL HCL 0.63 MG/3ML IN NEBU
0.6300 mg | INHALATION_SOLUTION | Freq: Four times a day (QID) | RESPIRATORY_TRACT | Status: DC
  Administered 2011-06-30: 0.63 mg via RESPIRATORY_TRACT
  Filled 2011-06-29 (×6): qty 3

## 2011-06-29 MED ORDER — ASPIRIN EC 81 MG PO TBEC
81.0000 mg | DELAYED_RELEASE_TABLET | Freq: Every day | ORAL | Status: DC
  Administered 2011-06-29 – 2011-07-03 (×5): 81 mg via ORAL
  Filled 2011-06-29 (×5): qty 1

## 2011-06-29 MED ORDER — WARFARIN - PHARMACIST DOSING INPATIENT: Freq: Every day | Status: DC

## 2011-06-29 MED ORDER — ACETAMINOPHEN 325 MG PO TABS
650.0000 mg | ORAL_TABLET | ORAL | Status: DC | PRN
  Administered 2011-06-30 – 2011-07-02 (×2): 650 mg via ORAL
  Filled 2011-06-29 (×2): qty 2

## 2011-06-29 MED ORDER — GUAIFENESIN ER 600 MG PO TB12
600.0000 mg | ORAL_TABLET | Freq: Two times a day (BID) | ORAL | Status: DC | PRN
  Administered 2011-06-29 (×2): 600 mg via ORAL
  Filled 2011-06-29 (×2): qty 1

## 2011-06-29 MED ORDER — FUROSEMIDE 10 MG/ML IJ SOLN
40.0000 mg | Freq: Two times a day (BID) | INTRAMUSCULAR | Status: DC
  Administered 2011-06-29 – 2011-06-30 (×3): 40 mg via INTRAVENOUS
  Filled 2011-06-29 (×7): qty 4

## 2011-06-29 MED ORDER — LISINOPRIL 10 MG PO TABS
10.0000 mg | ORAL_TABLET | Freq: Every day | ORAL | Status: DC
  Administered 2011-06-29: 10 mg via ORAL
  Filled 2011-06-29 (×2): qty 1

## 2011-06-29 MED ORDER — IPRATROPIUM BROMIDE 0.02 % IN SOLN
0.5000 mg | Freq: Four times a day (QID) | RESPIRATORY_TRACT | Status: DC
  Administered 2011-06-30: 0.5 mg via RESPIRATORY_TRACT
  Filled 2011-06-29: qty 2.5

## 2011-06-29 NOTE — Progress Notes (Signed)
Subjective:  Dyspnea is improved.  On IV lasix. No chest pain.  Objective:  Vital Signs in the last 24 hours: Temp:  [96.9 F (36.1 C)-98.6 F (37 C)] 96.9 F (36.1 C) (04/22 0217) Pulse Rate:  [41-112] 75  (04/22 0217) Resp:  [12-31] 20  (04/22 0217) BP: (107-184)/(51-125) 132/75 mmHg (04/22 0547) SpO2:  [84 %-100 %] 92 % (04/22 0217) Weight:  [88.2 kg (194 lb 7.1 oz)] 88.2 kg (194 lb 7.1 oz) (04/22 0219)  Intake/Output from previous day: 04/21 0701 - 04/22 0700 In: 125 [I.V.:125] Out: 300 [Urine:300] Intake/Output from this shift:       . albuterol  2.5 mg Nebulization Once  . albuterol  5 mg Nebulization Once  . albuterol      . allopurinol  300 mg Oral Daily  . antiseptic oral rinse  15 mL Mouth Rinse BID  . aspirin EC  81 mg Oral Daily  . atorvastatin  10 mg Oral QPC supper  . diltiazem  15 mg Intravenous Once  . furosemide  40 mg Intravenous BID  . furosemide  80 mg Intravenous Once  . ipratropium      . ipratropium  0.5 mg Nebulization Once  . ipratropium  0.5 mg Nebulization Once  . lisinopril  10 mg Oral Daily  . methylPREDNISolone (SOLU-MEDROL) injection  125 mg Intravenous Once  . metoprolol  50 mg Oral BID  . nitroGLYCERIN  1 inch Topical Q6H  . pantoprazole  40 mg Oral Q1200  . sodium chloride  3 mL Intravenous Q12H  . warfarin  2.5 mg Oral Custom  . warfarin  5 mg Oral Custom  . Warfarin - Physician Dosing Inpatient   Does not apply q1800  . DISCONTD: atorvastatin  10 mg Oral Daily  . DISCONTD: warfarin  2.5-5 mg Oral Daily  . DISCONTD: warfarin  5 mg Oral UD      . diltiazem (CARDIZEM) infusion 5 mg/hr (06/28/11 2217)    Physical Exam: The patient appears to be in no distress.  Head and neck exam reveals that the pupils are equal and reactive.  The extraocular movements are full.  There is no scleral icterus.  Mouth and pharynx are benign.  No lymphadenopathy.  No carotid bruits.  The jugular venous pressure is difficult to see.  Thyroid  is not enlarged or tender.  Chest is clear to percussion and auscultation.  No rales or rhonchi. Poor inspiratory effort.  Decreased breath sounds at bases.  Expansion of the chest is symmetrical.  Heart reveals no abnormal lift or heave.  First and second heart sounds are normal.  There is no  gallop rub or click. Gr 1/6 apical systolic murmur.  The abdomen is soft and nontender.  Bowel sounds are normoactive.  There is no hepatosplenomegaly or mass.  There are no abdominal bruits.  Extremities reveal no phlebitis or edema.  Pedal pulses are good.  There is no cyanosis or clubbing.  Neurologic exam is normal strength and no lateralizing weakness.  No sensory deficits.  Integument reveals no rash  Lab Results:  [facility] 06/28/11 1924  WBC 6.7  HGB 15.0  PLT 121*    Basename 06/28/11 1924  NA 142  K 3.9  CL 100  CO2 32  GLUCOSE 103*  BUN 25*  CREATININE 1.38*    Basename 06/28/11 1925  TROPONINI <0.30   Hepatic Function Panel No results found for this basename: PROT,ALBUMIN,AST,ALT,ALKPHOS,BILITOT,BILIDIR,IBILI in the last 72 hours No results found for this  basename: CHOL in the last 72 hours No results found for this basename: PROTIME in the last 72 hours  Imaging: Dg Chest 2 View  06/28/2011  *RADIOLOGY REPORT*  Clinical Data: Cough and congestion, shortness of breath  CHEST - 2 VIEW  Comparison: 12/16/2010  Findings: Cardiac leads obscure detail.  Moderate enlargement of the cardiomediastinal silhouette is present of the central vascular congestion but no overt edema.  Left lower lobe scarring is stable. Apparent retrocardiac opacity on the frontal view is likely due to hypoaeration and summation of shadows due to positioning, based on review of the lateral image.  No pneumothorax.  No acute osseous finding. The spine is not well evaluated on the lateral projection due to habitus and technique.  IMPRESSION: Left lower lobe scarring again noted.  Low volumes with bibasilar  atelectasis but no focal acute finding otherwise. If the patient's symptoms continue, consider PA and lateral chest radiographs obtained at full inspiration when the patient is clinically able.  Original Report Authenticated By: Harrel Lemon, M.D.    Cardiac Studies: Telemetry shows atrial fib with controlled ventricular response. Assessment/Plan:  Patient Active Hospital Problem List: Chronic atrial fibrillation (07/21/2010)   Assessment: Rate controlled   Plan: Stop IV diltiazem, continue BB Coronary artery disease ()   Assessment: Enzymes neg   Plan: Continue BB, statin, ASA Dyspnea ()   Assessment: Improved on IV lasix   Plan: 2D echo today.  On lisinopril.  Follow BMET   LOS: 1 day    Cassell Clement 06/29/2011, 8:28 AM

## 2011-06-29 NOTE — ED Provider Notes (Signed)
Medical screening examination/treatment/procedure(s) were conducted as a shared visit with non-physician practitioner(s) and myself.  I personally evaluated the patient during the encounter 78 year old man with Hx of atrial fibrillation, COPD complains of cough and chest congestion.  Exam shows some wheezing, as well as rapid heart rate that on EKG was atrial fibrillation.  Lab workup showed cardiomegaly and elevated pro BNP.  Rx for CHF with Lasix, NTG paste.  Discussed with Natasha Bence, M.D., [facility] Cardiologist, who will admit pt.  Carleene Cooper III, MD 06/29/11 2028

## 2011-06-29 NOTE — Progress Notes (Signed)
ANTICOAGULATION CONSULT NOTE - Initial Consult  Pharmacy Consult for coumadin Indication: atrial fibrillation  No Known Allergies  Patient Measurements: Height: 5\' 8"  (172.7 cm) Weight: 194 lb 7.1 oz (88.2 kg) (scale c) IBW/kg (Calculated) : 68.4    Vital Signs: Temp: 96.9 F (36.1 C) (04/22 0217) Temp src: Oral (04/22 0217) BP: 132/75 mmHg (04/22 0547) Pulse Rate: 75  (04/22 0217)  Labs:  Basename 06/29/11 0541 06/28/11 1925 06/28/11 1924  HGB -- -- 15.0  HCT -- -- 46.1  PLT -- -- 121*  APTT -- -- --  LABPROT 26.6* -- 27.0*  INR 2.41* -- 2.45*  HEPARINUNFRC -- -- --  CREATININE -- -- 1.38*  CKTOTAL -- -- --  CKMB -- -- --  TROPONINI -- <0.30 --   Estimated Creatinine Clearance: 45.3 ml/min (by C-G formula based on Cr of 1.38).  Medical History: Past Medical History  Diagnosis Date  . Coronary artery disease   . Dyspnea   . COPD (chronic obstructive pulmonary disease)   . Paroxysmal atrial fibrillation   . Hyperlipidemia   . Dyspepsia   . CHF (congestive heart failure)   . Hypertension   . GERD (gastroesophageal reflux disease)     Medications:  Prescriptions prior to admission  Medication Sig Dispense Refill  . allopurinol (ZYLOPRIM) 300 MG tablet Take 300 mg by mouth daily.      Marland Kitchen atorvastatin (LIPITOR) 10 MG tablet Take 10 mg by mouth daily.      . furosemide (LASIX) 40 MG tablet Take 40 mg by mouth 2 (two) times daily.       Marland Kitchen HYDROcodone-acetaminophen (NORCO) 5-325 MG per tablet Take 1 tablet by mouth daily as needed. For pain.      . metoprolol (LOPRESSOR) 100 MG tablet Take 50 mg by mouth 2 (two) times daily.       . nitroGLYCERIN (NITROSTAT) 0.4 MG SL tablet Place 0.4 mg under the tongue every 5 (five) minutes as needed. For chest pain.      Marland Kitchen omeprazole (PRILOSEC) 40 MG capsule Take 40 mg by mouth daily.      . potassium chloride SA (K-DUR,KLOR-CON) 20 MEQ tablet Take 20 mEq by mouth daily.      Marland Kitchen warfarin (COUMADIN) 5 MG tablet Take 2.5-5 mg by  mouth daily. Take 5 MG every day of the week except on Saturdays and Sundays; on Saturdays and Sundays take 2.5 MG        Assessment: 78 yo male with afib on coumadin PTA and INR (2.41) is at goal on home regimen.  Patient noted with HF and echo pending.  Goal of Therapy:  INR 2-3   Plan:  -Continue coumadin as taken at home -INR daily while in acute HF  Scott Reese 06/29/2011,9:05 AM

## 2011-06-29 NOTE — Progress Notes (Signed)
  Echocardiogram 2D Echocardiogram has been performed.  Tishana Clinkenbeard L 06/29/2011, 9:48 AM

## 2011-06-29 NOTE — Progress Notes (Signed)
06/29/11 1000 UR Completed. Tera Mater, RN, BSN Nurse Case Manager [phone]

## 2011-06-30 ENCOUNTER — Inpatient Hospital Stay (HOSPITAL_COMMUNITY): Payer: Medicare Other

## 2011-06-30 ENCOUNTER — Encounter (HOSPITAL_COMMUNITY): Payer: Medicare Other

## 2011-06-30 DIAGNOSIS — I509 Heart failure, unspecified: Secondary | ICD-10-CM

## 2011-06-30 DIAGNOSIS — J441 Chronic obstructive pulmonary disease with (acute) exacerbation: Secondary | ICD-10-CM

## 2011-06-30 DIAGNOSIS — R0609 Other forms of dyspnea: Secondary | ICD-10-CM

## 2011-06-30 DIAGNOSIS — R05 Cough: Secondary | ICD-10-CM

## 2011-06-30 LAB — BASIC METABOLIC PANEL
Chloride: 102 mEq/L (ref 96–112)
GFR calc Af Amer: 56 mL/min — ABNORMAL LOW (ref >90)
Potassium: 4.2 mEq/L (ref 3.5–5.1)

## 2011-06-30 LAB — BLOOD GAS, ARTERIAL
Bicarbonate: 32.2 mEq/L — ABNORMAL HIGH (ref 20.0–24.0)
Drawn by: 320991
FIO2: 0.21 %
O2 Saturation: 90.6 %
pCO2 arterial: 51.7 mmHg — ABNORMAL HIGH (ref 35.0–45.0)
pH, Arterial: 7.411 (ref 7.350–7.450)
pO2, Arterial: 58.2 mmHg — ABNORMAL LOW (ref 80.0–100.0)

## 2011-06-30 LAB — IGE: IgE (Immunoglobulin E), Serum: 12.4 IU/mL (ref 0.0–180.0)

## 2011-06-30 LAB — PROTIME-INR
INR: 2.38 — ABNORMAL HIGH (ref 0.00–1.49)
Prothrombin Time: 26.4 seconds — ABNORMAL HIGH (ref 11.6–15.2)

## 2011-06-30 MED ORDER — LORAZEPAM 0.5 MG PO TABS
1.0000 mg | ORAL_TABLET | Freq: Four times a day (QID) | ORAL | Status: DC | PRN
  Administered 2011-06-30: 1 mg via ORAL
  Filled 2011-06-30: qty 2

## 2011-06-30 MED ORDER — ADULT MULTIVITAMIN W/MINERALS CH
1.0000 | ORAL_TABLET | Freq: Every day | ORAL | Status: DC
  Administered 2011-06-30 – 2011-07-03 (×4): 1 via ORAL
  Filled 2011-06-30 (×4): qty 1

## 2011-06-30 MED ORDER — THIAMINE HCL 100 MG/ML IJ SOLN
100.0000 mg | Freq: Every day | INTRAMUSCULAR | Status: DC
  Filled 2011-06-30 (×4): qty 1

## 2011-06-30 MED ORDER — VITAMIN B-1 100 MG PO TABS
100.0000 mg | ORAL_TABLET | Freq: Every day | ORAL | Status: DC
  Administered 2011-06-30: 100 mg via ORAL
  Filled 2011-06-30: qty 1

## 2011-06-30 MED ORDER — IPRATROPIUM BROMIDE 0.02 % IN SOLN
0.5000 mg | Freq: Three times a day (TID) | RESPIRATORY_TRACT | Status: DC
  Administered 2011-06-30 – 2011-07-01 (×3): 0.5 mg via RESPIRATORY_TRACT
  Filled 2011-06-30 (×3): qty 2.5

## 2011-06-30 MED ORDER — LEVALBUTEROL HCL 0.63 MG/3ML IN NEBU
0.6300 mg | INHALATION_SOLUTION | Freq: Three times a day (TID) | RESPIRATORY_TRACT | Status: DC
  Administered 2011-06-30 – 2011-07-01 (×3): 0.63 mg via RESPIRATORY_TRACT
  Filled 2011-06-30 (×5): qty 3

## 2011-06-30 MED ORDER — LORAZEPAM 2 MG/ML IJ SOLN: 1.0000 mg | Freq: Four times a day (QID) | INTRAMUSCULAR | Status: DC | PRN

## 2011-06-30 MED ORDER — FOLIC ACID 1 MG PO TABS
1.0000 mg | ORAL_TABLET | Freq: Every day | ORAL | Status: DC
  Administered 2011-06-30: 1 mg via ORAL
  Filled 2011-06-30: qty 1

## 2011-06-30 MED ORDER — FOLIC ACID 1 MG PO TABS
1.0000 mg | ORAL_TABLET | Freq: Every day | ORAL | Status: DC
  Administered 2011-06-30 – 2011-07-03 (×4): 1 mg via ORAL
  Filled 2011-06-30 (×4): qty 1

## 2011-06-30 MED ORDER — BUDESONIDE 0.25 MG/2ML IN SUSP
0.2500 mg | Freq: Three times a day (TID) | RESPIRATORY_TRACT | Status: DC
  Administered 2011-06-30 – 2011-07-03 (×8): 0.25 mg via RESPIRATORY_TRACT
  Filled 2011-06-30 (×12): qty 2

## 2011-06-30 MED ORDER — PANTOPRAZOLE SODIUM 40 MG PO TBEC
40.0000 mg | DELAYED_RELEASE_TABLET | Freq: Two times a day (BID) | ORAL | Status: DC
  Administered 2011-06-30 – 2011-07-01 (×2): 40 mg via ORAL
  Filled 2011-06-30 (×2): qty 1

## 2011-06-30 MED ORDER — VITAMIN B-1 100 MG PO TABS
100.0000 mg | ORAL_TABLET | Freq: Every day | ORAL | Status: DC
  Administered 2011-06-30 – 2011-07-02 (×3): 100 mg via ORAL
  Filled 2011-06-30 (×4): qty 1

## 2011-06-30 MED ORDER — HYDROCOD POLST-CHLORPHEN POLST 10-8 MG/5ML PO LQCR
5.0000 mL | Freq: Two times a day (BID) | ORAL | Status: DC | PRN
  Administered 2011-06-30: 5 mL via ORAL
  Filled 2011-06-30: qty 5

## 2011-06-30 MED ORDER — PREDNISONE 20 MG PO TABS
40.0000 mg | ORAL_TABLET | Freq: Every day | ORAL | Status: DC
  Administered 2011-07-01: 40 mg via ORAL
  Filled 2011-06-30 (×2): qty 2

## 2011-06-30 MED ORDER — FUROSEMIDE 40 MG PO TABS
40.0000 mg | ORAL_TABLET | Freq: Two times a day (BID) | ORAL | Status: DC
  Administered 2011-06-30 – 2011-07-03 (×6): 40 mg via ORAL
  Filled 2011-06-30 (×8): qty 1

## 2011-06-30 MED ORDER — LEVOFLOXACIN 500 MG PO TABS
500.0000 mg | ORAL_TABLET | Freq: Every day | ORAL | Status: DC
  Administered 2011-06-30 – 2011-07-03 (×4): 500 mg via ORAL
  Filled 2011-06-30 (×4): qty 1

## 2011-06-30 NOTE — Progress Notes (Signed)
Wife at bedside and updated on plan of care. Charge RN notified that pt has order for sitter. Bed alarm in place. CIWA to be done by night RN. Will continue to monitor.

## 2011-06-30 NOTE — Progress Notes (Addendum)
ANTICOAGULATION CONSULT NOTE - Initial Consult  Pharmacy Consult for coumadin Indication: atrial fibrillation  No Known Allergies  Patient Measurements: Height: 5\' 8"  (172.7 cm) Weight: 194 lb 11.2 oz (88.315 kg) (scale c) IBW/kg (Calculated) : 68.4    Vital Signs: Temp: 98.3 F (36.8 C) (04/23 0935) Temp src: Oral (04/23 0530) BP: 120/78 mmHg (04/23 0935) Pulse Rate: 91  (04/23 0935)  Labs:  Alvira Philips 06/30/11 0645 06/29/11 0541 06/28/11 1925 06/28/11 1924  HGB -- -- -- 15.0  HCT -- -- -- 46.1  PLT -- -- -- 121*  APTT -- -- -- --  LABPROT 26.4* 26.6* -- 27.0*  INR 2.38* 2.41* -- 2.45*  HEPARINUNFRC -- -- -- --  CREATININE 1.34 -- -- 1.38*  CKTOTAL -- -- -- --  CKMB -- -- -- --  TROPONINI -- -- <0.30 --   Estimated Creatinine Clearance: 46.7 ml/min (by C-G formula based on Cr of 1.34).    Assessment: 78 yo male with afib on coumadin PTA and INR (2.38) is at goal on home regimen.    Goal of Therapy:  INR 2-3   Plan:  -Continue coumadin as taken at home -Will change INRs to MWF only  [name] PharmD 06/30/2011,9:41 AM

## 2011-06-30 NOTE — Progress Notes (Signed)
Called to see patient because of increased confusion and agitation. Does drink a fifth of vodka a week and may be withdrawal. Will order CIWA.  Chest xray today stable.

## 2011-06-30 NOTE — Progress Notes (Signed)
Subjective:  Dyspnea is improved.  On IV lasix.No change in weight. No chest pain. No edema.Terrible dry cough, no sputum. Afebrile.  Objective:  Vital Signs in the last 24 hours: Temp:  [97.4 F (36.3 C)-98.5 F (36.9 C)] 98.3 F (36.8 C) (04/23 0935) Pulse Rate:  [72-91] 91  (04/23 0935) Resp:  [18-20] 20  (04/23 0935) BP: (115-128)/(55-78) 120/78 mmHg (04/23 0935) SpO2:  [90 %-98 %] 97 % (04/23 0935) Weight:  [88.315 kg (194 lb 11.2 oz)] 88.315 kg (194 lb 11.2 oz) (04/23 0530)  Intake/Output from previous day: 04/22 0701 - 04/23 0700 In: 1370 [P.O.:1370] Out: 1190 [Urine:1190] Intake/Output from this shift: Total I/O In: 363 [P.O.:360; I.V.:3] Out: -      . allopurinol  300 mg Oral Daily  . antiseptic oral rinse  15 mL Mouth Rinse BID  . aspirin EC  81 mg Oral Daily  . atorvastatin  10 mg Oral QPC supper  . furosemide  40 mg Intravenous BID  . ipratropium  0.5 mg Nebulization Q8H  . levalbuterol  0.63 mg Nebulization Q8H  . metoprolol  50 mg Oral BID  . nitroGLYCERIN  1 inch Topical Q6H  . pantoprazole  40 mg Oral Q1200  . sodium chloride  3 mL Intravenous Q12H  . warfarin  2.5 mg Oral Custom  . warfarin  5 mg Oral Custom  . Warfarin - Pharmacist Dosing Inpatient   Does not apply q1800  . DISCONTD: ipratropium  0.5 mg Nebulization Q6H  . DISCONTD: levalbuterol  0.63 mg Nebulization Q6H  . DISCONTD: lisinopril  10 mg Oral Daily      Physical Exam: The patient appears to be in no distress.  Head and neck exam reveals that the pupils are equal and reactive.  The extraocular movements are full.  There is no scleral icterus.  Mouth and pharynx are benign.  No lymphadenopathy.  No carotid bruits.  The jugular venous pressure is difficult to see.  Thyroid is not enlarged or tender.  Chest is clear to percussion and auscultation.  Mild expiratory wheezing. Poor inspiratory effort.  Decreased breath sounds at bases.  Expansion of the chest is symmetrical.  Heart  reveals no abnormal lift or heave.  First and second heart sounds are normal.  There is no  gallop rub or click. Gr 1/6 apical systolic murmur.  The abdomen is soft and nontender.  Bowel sounds are normoactive.  There is no hepatosplenomegaly or mass.  There are no abdominal bruits.  Extremities reveal no phlebitis or edema.  Pedal pulses are good.  There is no cyanosis or clubbing.  Neurologic exam is normal strength and no lateralizing weakness.  No sensory deficits.  Integument reveals no rash  Lab Results:  [facility] 06/28/11 1924  WBC 6.7  HGB 15.0  PLT 121*    Basename 06/30/11 0645 06/28/11 1924  NA 142 142  K 4.2 3.9  CL 102 100  CO2 31 32  GLUCOSE 103* 103*  BUN 37* 25*  CREATININE 1.34 1.38*    Basename 06/28/11 1925  TROPONINI <0.30   Hepatic Function Panel No results found for this basename: PROT,ALBUMIN,AST,ALT,ALKPHOS,BILITOT,BILIDIR,IBILI in the last 72 hours No results found for this basename: CHOL in the last 72 hours No results found for this basename: PROTIME in the last 72 hours  Imaging: Dg Chest 2 View  06/28/2011  *RADIOLOGY REPORT*  Clinical Data: Cough and congestion, shortness of breath  CHEST - 2 VIEW  Comparison: 12/16/2010  Findings: Cardiac  leads obscure detail.  Moderate enlargement of the cardiomediastinal silhouette is present of the central vascular congestion but no overt edema.  Left lower lobe scarring is stable. Apparent retrocardiac opacity on the frontal view is likely due to hypoaeration and summation of shadows due to positioning, based on review of the lateral image.  No pneumothorax.  No acute osseous finding. The spine is not well evaluated on the lateral projection due to habitus and technique.  IMPRESSION: Left lower lobe scarring again noted.  Low volumes with bibasilar atelectasis but no focal acute finding otherwise. If the patient's symptoms continue, consider PA and lateral chest radiographs obtained at full inspiration when the  patient is clinically able.  Original Report Authenticated By: Harrel Lemon, M.D.    Cardiac Studies: Telemetry shows atrial fib with controlled ventricular response. 2D Echo showed normal LV systolic function, Mild MR, severe TR Assessment/Plan:  Patient Active Hospital Problem List: Chronic atrial fibrillation (07/21/2010)   Assessment: Rate controlled   Plan:  continue BB Coronary artery disease ()   Assessment: Enzymes neg   Plan: Continue BB, statin, ASA Dyspnea ()   Assessment: Improved on IV lasix. Switch to oral lasix.  Repeat BNP in am   Plan: Dry cough still severe.  Will stop lisinipril. Will ask pulmonary to see. Repeat chest xray    LOS: 2 days    Cassell Clement 06/30/2011, 9:41 AM

## 2011-06-30 NOTE — Progress Notes (Signed)
Pt took off O2 and walking without O2. When assessed pt at 1500, pt was AAOx4. Pt became increasingly confused and agitated. O2 sats 84% on RA. Pt placed back on 3L O2 and sats 93%. Pt HR up as high as 180s and sustaining in 100-140s. Pt refusing to keep O2 in nose. MD notified. MD states will come to floor to assess pt.  Pt moved to camera room and bed alarm placed.  Pt moved to chair- NT sitting with pt at moment.  Pt's wife called and will be in to monitor pt in the meantime. Will continue to monitor pt.

## 2011-06-30 NOTE — Consult Note (Signed)
Name: Scott Reese MRN: [id number] DOB: 1927/12/03    LOS: 2 Requesting MD: Patty Sermons Regarding: COPD, Cough  ADMIT/ CONSULT NOTE  History of Present Illness: 78 yo WM , former heavy smoker (2ppd quit 20 years ago) who reports increasing sob over 2 years period. He presents 4/21 with increased SOB over his baseline, + chill x 1 , along with non productive x 3 days. Of note he further describes allergic sxs over last 2 days (watery eyes, mild wheezes). He is notable on admit for poorly rate controlled afib. PCCM asked to evaluate and assist with SOB,ote he lives with wife and 13 cats.  Lines / Drains:   Cultures: none  Antibiotics: none  Tests / Events:   Subjective:  Sitting in chair with no increased wob.   Past Medical History  Diagnosis Date  . Coronary artery disease   . Dyspnea   . COPD (chronic obstructive pulmonary disease)   . Paroxysmal atrial fibrillation   . Hyperlipidemia   . Dyspepsia   . CHF (congestive heart failure)   . Hypertension   . GERD (gastroesophageal reflux disease)    Past Surgical History  Procedure Date  . Cardiac catheterization 2004    EF 50%/L circumflex artery very lg vessel/proximal vessel is approx. 29mm/40-50% eccentric stenosis proximal aspect of vessel  . Transesophageal echocardiogram 06/18/99    no evidence of any clot in the L atruim or L atrial appendage & no mitral regurgiation/small central jet of aortic insufficiency  . Cardioversion 06/18/99    electrical atrical/ converted after single 200 watt-sec shock with AP paddles/ tolerated procedure well  . Breast surgery    Prior to Admission medications   Medication Sig Start Date End Date Taking? Authorizing Provider  allopurinol (ZYLOPRIM) 300 MG tablet Take 300 mg by mouth daily.   Yes Historical Provider, MD  atorvastatin  (LIPITOR) 10 MG tablet Take 10 mg by mouth daily. 12/05/10  Yes Cassell Clement, MD  furosemide (LASIX) 40 MG tablet Take 40 mg by mouth 2 (two) times daily.  11/20/10  Yes Cassell Clement, MD  HYDROcodone-acetaminophen (NORCO) 5-325 MG per tablet Take 1 tablet by mouth daily as needed. For pain.   Yes Historical Provider, MD  metoprolol (LOPRESSOR) 100 MG tablet Take 50 mg by mouth 2 (two) times daily.  11/03/10  Yes Cassell Clement, MD  nitroGLYCERIN (NITROSTAT) 0.4 MG SL tablet Place 0.4 mg under the tongue every 5 (five) minutes as needed. For chest pain.   Yes Historical Provider, MD  omeprazole (PRILOSEC) 40 MG capsule Take 40 mg by mouth daily. 03/18/11 03/17/12 Yes Cassell Clement, MD  potassium chloride SA (K-DUR,KLOR-CON) 20 MEQ tablet Take 20 mEq by mouth daily.   Yes Historical Provider, MD  warfarin (COUMADIN) 5 MG tablet Take 2.5-5 mg by mouth daily. Take 5 MG every day of the week except on  Saturdays and Sundays; on Saturdays and Sundays take 2.5 MG 06/15/11  Yes Cassell Clement, MD   Allergies No Known Allergies  Family History Family History  Problem Relation Age of Onset  . Diabetes Father   . Stroke Brother   . Cancer Daughter     Social History  reports that he quit smoking about 22 years ago. His smoking use included Cigarettes. He has a 80 pack-year smoking history. He has never used smokeless tobacco. He reports that he drinks alcohol. He reports that he does not use illicit drugs.  Review Of Systems  11 points review of systems is negative with an exception of listed in HPI.  Vital Signs: Temp:  [97.4 F (36.3 C)-98.5 F (36.9 C)] 98.3 F (36.8 C) (04/23 0935) Pulse Rate:  [72-91] 91  (04/23 0935) Resp:  [18-20] 20  (04/23 0935) BP: (115-128)/(55-78) 120/78 mmHg (04/23 0935) SpO2:  [90 %-97 %] 97 % (04/23 0935) Weight:  [194 lb 11.2 oz (88.315 kg)] 194 lb 11.2 oz (88.315 kg) (04/23 0530) I/O last 3 completed shifts: In: 1495 [P.O.:1370; I.V.:125] Out: 1490  [Urine:1490]  Physical Examination: General: WNWDWM NAD @rest  Neuro:  intact  HEENT:  No jvd Cardiovascular:  hsir ri Lungs:  Decreased bases, no wheeze Abdomen:  +bs Musculoskeletal:  intact Skin:  warm  Ventilator settings:    Labs and Imaging:  Dg Chest 2 View  06/28/2011  *RADIOLOGY REPORT*  Clinical Data: Cough and congestion, shortness of breath  CHEST - 2 VIEW  Comparison: 12/16/2010  Findings: Cardiac leads obscure detail.  Moderate enlargement of the cardiomediastinal silhouette is present of the central vascular congestion but no overt edema.  Left lower lobe scarring is stable. Apparent retrocardiac opacity on the frontal view is likely due to hypoaeration and summation of shadows due to positioning, based on review of the lateral image.  No pneumothorax.  No acute osseous finding. The spine is not well evaluated on the lateral projection due to habitus and technique.  IMPRESSION: Left lower lobe scarring again noted.  Low volumes with bibasilar atelectasis but no focal acute finding otherwise. If the patient's symptoms continue, consider PA and lateral chest radiographs obtained at full inspiration when the patient is clinically able.  Original Report Authenticated By: Harrel Lemon, M.D.    Lab 06/30/11 0645 06/28/11 1924  NA 142 142  K 4.2 3.9  CL 102 100  CO2 31 32  BUN 37* 25*  CREATININE 1.34 1.38*  GLUCOSE 103* 103*    Lab 06/28/11 1924  HGB 15.0  HCT 46.1  WBC 6.7  PLT 121*   BNP (last 3 results)  Basename 06/28/11 1925  PROBNP 2997.0*   ABG No results found for this basename: phart, pco2, pco2art, po2, po2art, hco3, tco2, acidbasedef, o2sat   Dg Chest 2 View  06/30/2011  *RADIOLOGY REPORT*  Clinical Data: Cough, dyspnea  CHEST - 2 VIEW  Comparison: Chest radiograph 06/28/2011  Findings: Stable enlarged cardiac silhouette.  No effusion, infiltrate, or pneumothorax.  There is central venous pulmonary congestion which is slightly increased compared to  prior. Degenerative osteophytosis of the thoracic spine.  The there is a stable 7 mm sclerotic/calcific lesion projecting over the thoracic spine which either represents a benign bone island or calcified pulmonary nodule.  IMPRESSION:  1.  Cardiomegaly with mild increased central venous congestion. 2.  Degenerative changes of the spine.  Original Report Authenticated By: Genevive Bi, M.D.   Dg Chest 2 View  06/28/2011  *RADIOLOGY REPORT*  Clinical Data: Cough and congestion, shortness of breath  CHEST - 2 VIEW  Comparison: 12/16/2010  Findings: Cardiac leads obscure detail.  Moderate enlargement of the cardiomediastinal silhouette is present of the central vascular congestion but no overt edema.  Left lower lobe scarring is stable. Apparent retrocardiac opacity on the frontal view is likely due to hypoaeration and summation of shadows due to positioning, based on review of the lateral image.  No pneumothorax.  No acute osseous finding. The spine is not well evaluated on the lateral projection due to habitus and technique.  IMPRESSION: Left lower lobe scarring again noted.  Low volumes with bibasilar atelectasis but no focal acute finding otherwise. If the patient's symptoms continue, consider PA and lateral chest radiographs obtained at full inspiration when the patient is clinically able.  Original Report Authenticated By: Harrel Lemon, M.D.     Assessment and Plan: Cough and increased SOB, superimposed over 2 year decline in SOB with ADL's. ? Component of allergic component with pollen and cat dander alond with copd. Not on ACE-I, cough non productive, therefore. Afib with RVR may have worsened SOB.    A: AECOPD - agree with xopenex -add cough suppression - inhaled steroid -pul toilet -agree with diuresis -reflux tx -home BD's with MDI till cough better then dry powder. -may need allergy testing  AFIB/CHF -per cards  ETOH use -consider stopping or lower intake     -consider OPT  FU with pulmonary Best practices / Disposition: -->ICU status under PCCM -->full code -->Heparin for DVT Px -->Protonix for GI Px -->diet -->family updated at bedside  [facility] ACNP [facility]ack PCCM Pager [phone] till 3 pm If no answer page [phone] 06/30/2011, 10:39 AM   STAFF NOTE: I, Dr Lavinia Sharps have personally reviewed patient's available data, including medical history, events of note, physical examination and test results as part of my evaluation. I have discussed with resident/NP and other care providers such as pharmacist, RN and RRT.  In addition,  I personally evaluated patient and elicited key findings of 120 pack smoker, quit age 59 (now age 18). Has insidious onset progressive dyspnea class 3 with cough past 2 years associated with 17 cats at home. PAst week developed URI and then progressively worse resulting in admission. Hx very suggestive of AECOPD with undiagnosied COPD. Possible allergic component. Will Rx for AECOPD with nebs, prednisone and antibitiocis. GEt spirometry and abg. Check IgE for allergy evaluation.  Rest per NP/medical resident whose note is outlined above and that I agree with   Dr. Kalman Shan, M.D., [facility].C.P Pulmonary and Critical Care Medicine Staff Physician [facility] [place] Pulmonary and Critical Care Pager: [phone], If no answer or between  15:00h - 7:00h: call [phone]  06/30/2011 11:55 AM

## 2011-07-01 ENCOUNTER — Inpatient Hospital Stay (HOSPITAL_COMMUNITY): Payer: Medicare Other

## 2011-07-01 LAB — CBC
MCHC: 32.2 g/dL (ref 30.0–36.0)
Platelets: 102 10*3/uL — ABNORMAL LOW (ref 150–400)
RDW: 16 % — ABNORMAL HIGH (ref 11.5–15.5)
WBC: 8.4 10*3/uL (ref 4.0–10.5)

## 2011-07-01 LAB — PROTIME-INR
INR: 1.93 — ABNORMAL HIGH (ref 0.00–1.49)
Prothrombin Time: 22.4 seconds — ABNORMAL HIGH (ref 11.6–15.2)

## 2011-07-01 LAB — GLUCOSE, CAPILLARY: Glucose-Capillary: 117 mg/dL — ABNORMAL HIGH (ref 70–99)

## 2011-07-01 LAB — PRO B NATRIURETIC PEPTIDE: Pro B Natriuretic peptide (BNP): 2388 pg/mL — ABNORMAL HIGH (ref 0–450)

## 2011-07-01 MED ORDER — METHYLPREDNISOLONE SODIUM SUCC 125 MG IJ SOLR
80.0000 mg | Freq: Three times a day (TID) | INTRAMUSCULAR | Status: DC
  Administered 2011-07-01 – 2011-07-03 (×6): 80 mg via INTRAVENOUS
  Filled 2011-07-01 (×8): qty 1.28

## 2011-07-01 MED ORDER — WARFARIN SODIUM 7.5 MG PO TABS
7.5000 mg | ORAL_TABLET | Freq: Once | ORAL | Status: AC
  Administered 2011-07-01: 7.5 mg via ORAL
  Filled 2011-07-01: qty 1

## 2011-07-01 MED ORDER — IPRATROPIUM BROMIDE 0.02 % IN SOLN
0.5000 mg | RESPIRATORY_TRACT | Status: DC
  Administered 2011-07-01 – 2011-07-02 (×6): 0.5 mg via RESPIRATORY_TRACT
  Filled 2011-07-01 (×6): qty 2.5

## 2011-07-01 MED ORDER — LEVALBUTEROL HCL 0.63 MG/3ML IN NEBU
0.6300 mg | INHALATION_SOLUTION | RESPIRATORY_TRACT | Status: DC
  Administered 2011-07-01 – 2011-07-02 (×6): 0.63 mg via RESPIRATORY_TRACT
  Filled 2011-07-01 (×12): qty 3

## 2011-07-01 MED ORDER — METHYLPREDNISOLONE SODIUM SUCC 125 MG IJ SOLR
INTRAMUSCULAR | Status: AC
  Filled 2011-07-01: qty 2

## 2011-07-01 NOTE — Clinical Documentation Improvement (Signed)
CHF DOCUMENTATION CLARIFICATION QUERY  THIS DOCUMENT IS NOT A PERMANENT PART OF THE MEDICAL RECORD  TO RESPOND TO THE THIS QUERY, FOLLOW THE INSTRUCTIONS BELOW:  1. If needed, update documentation for the patient's encounter via the notes activity.  2. Access this query again and click edit on the In [employer].  3. After updating, or not, click F2 to complete all highlighted (required) fields concerning your review. Select "additional documentation in the medical record" OR "no additional documentation provided".  4. Click Sign note button.  5. The deficiency will fall out of your In Basket *Please let us know if you are not able to complete this workflow by phone or e-mail (listed below).  Please update your documentation within the medical record to reflect your response to this query.                                                                                    07/01/11  Dear Dr. Patty Sermons / Associates,  In a better effort to capture your patient's severity of illness, reflect appropriate length of stay and utilization of resources, a review of the patient medical record has revealed the following indicators the diagnosis of Heart Failure.    Based on your clinical judgment, please clarify and document in a progress note and/or discharge summary the clinical condition associated with the following supporting information:  In responding to this query please exercise your independent judgment.  The fact that a query is asked, does not imply that any particular answer is desired or expected.  Possible Clinical Conditions?  Acute Systolic Congestive Heart Failure Acute Diastolic Congestive Heart Failure Acute Systolic & Diastolic Congestive Heart Failure Acute on Chronic Systolic Congestive Heart Failure Acute on Chronic Diastolic Congestive Heart Failure Acute on Chronic Systolic & Diastolic  Congestive Heart Failure Chronic Diastolic Congestive Heart Failure Chronic  Systolic & Diastolic Congestive Heart Failure Other Condition________________________________________ Cannot Clinically Determine  Supporting Information:  Risk Factors:(As per notes on H&P) "This is an 78 year old, white male, who presents with acute heart failure symptoms"  Signs & Symptoms:(As per notes on H&P)  "we feel this is more likely heart failure than chronic obstructive pulmonary disease exacerbationWe will empirically treat him as such.  We will initiate diltiazem infusion to control his rate and also help with his blood pressure.  We have also  just given him his first dose of IV Lasix in the ER.  We will see how he diuresis."   Diagnostics: Physical Exam: (As per notes) "Poor inspiratory effort.  Decreased breath sounds at bases. Dyspnea ()  Assessment: Improved on IV lasix.  Mild expiratory wheezing"   Radiology: CXR  06-28-10 IMPRESSION: Left lower lobe scarring again noted.  Low volumes with bibasilar atelectasis but no focal acute finding otherwise. If the patient's symptoms continue, consider PA and lateral chest radiographs obtained at full inspiration when the patient is clinically able  2D ECHO 06-29-10: (As per notes )"2D Echo showed normal LV systolic function, Mild MR, severe TR" .    Treatment: (as per notes) IV LASIX  Reviewed: additional documentation in the medical record  Thank You,  Joanette Gula Delk RN,BSN Clinical Documentation  Specialist: [phone] Pager Health Information Management [place]

## 2011-07-01 NOTE — Consult Note (Signed)
Name: Scott Reese MRN: [id number] DOB: 1927/10/18    LOS: 3 Requesting MD: Patty Sermons Regarding: COPD, Cough  ADMIT/ CONSULT NOTE  History of Present Illness: 78 yo WM , former heavy smoker (2ppd quit 20 years ago) who reports increasing sob over 2 years period. He presents 4/21 with increased SOB over his baseline, + chill x 1 , along with non productive x 3 days. Of note he further describes allergic sxs over last 2 days (watery eyes, mild wheezes). He is notable on admit for poorly rate controlled afib. PCCM asked to evaluate and assist with SOB,ote he lives with wife and 13 cats.  Lines / Drains:   Cultures: none  Antibiotics: none  Tests / Events:   Subjective:  Sitting in chair with no increased wob. Had episode of sundowners. But does complain of wheezing and conbestion. Spirometry shows gold stage 4 copd   Vital Signs: Temp:  [97 F (36.1 C)-99.6 F (37.6 C)] 98.8 F (37.1 C) (04/24 0630) Pulse Rate:  [81-156] 96  (04/24 0749) Resp:  [18-24] 20  (04/24 0749) BP: (100-157)/(63-102) 118/64 mmHg (04/24 0749) SpO2:  [84 %-99 %] 95 % (04/24 0749) Weight:  [191 lb 1.6 oz (86.682 kg)] 191 lb 1.6 oz (86.682 kg) (04/24 0630) I/O last 3 completed shifts: In: 1793 [P.O.:1790; I.V.:3] Out: 1825 [Urine:1825]  Physical Examination: General: WNWDWM NAD @rest  Neuro:  intact  HEENT:  No jvd Cardiovascular:  hsir ri Lungs:  Decreased bases, no wheeze, congested cough Abdomen:  +bs Musculoskeletal:  intact Skin:  warm      Labs and Imaging:  Dg Chest 2 View  06/30/2011  *RADIOLOGY REPORT*  Clinical Data: Cough, dyspnea  CHEST - 2 VIEW  Comparison: Chest radiograph 06/28/2011  Findings: Stable enlarged cardiac silhouette.  No effusion, infiltrate, or pneumothorax.  There is central venous pulmonary congestion which is  slightly increased compared to prior. Degenerative osteophytosis of the thoracic spine.  The there is a stable 7 mm sclerotic/calcific lesion projecting over the thoracic spine which either represents a benign bone island or calcified pulmonary nodule.  IMPRESSION:  1.  Cardiomegaly with mild increased central venous congestion. 2.  Degenerative changes of the spine.  Original Report Authenticated By: Genevive Bi, M.D.    Lab 06/30/11 0645 06/28/11 1924  NA 142 142  K 4.2 3.9  CL 102 100  CO2 31 32  BUN 37* 25*  CREATININE 1.34 1.38*  GLUCOSE 103* 103*    Lab 07/01/11 0505 06/28/11 1924  HGB 13.8 15.0  HCT 42.8 46.1  WBC 8.4 6.7  PLT 102* 121*   BNP (last 3 results)  Basename 06/28/11 1925  PROBNP 2997.0*   ABG    Component Value Date/Time   PHART 7.411 06/30/2011 1300   Dg Chest 2 View  06/30/2011  *RADIOLOGY REPORT*  Clinical Data: Cough, dyspnea  CHEST -  2 VIEW  Comparison: Chest radiograph 06/28/2011  Findings: Stable enlarged cardiac silhouette.  No effusion, infiltrate, or pneumothorax.  There is central venous pulmonary congestion which is slightly increased compared to prior. Degenerative osteophytosis of the thoracic spine.  The there is a stable 7 mm sclerotic/calcific lesion projecting over the thoracic spine which either represents a benign bone island or calcified pulmonary nodule.  IMPRESSION:  1.  Cardiomegaly with mild increased central venous congestion. 2.  Degenerative changes of the spine.  Original Report Authenticated By: Genevive Bi, M.D.    Echo - ef 55% severe TR  IgE 12.4  Spirometry - gold stage 4 copd  Assessment and Plan: Cough and increased SOB, superimposed over 2 year decline in SOB with ADL's. ? Component of allergic component with pollen and cat dander along with copd but IgE normal.  Afib with RVR may have worsened SOB.  4/24 better with negative i/o  A: AECOPD is likely dx. He appears to have very severe copd - agree with xopenex  and atrovent but increase frequency  - change prednisone to IV solumedrol - continue po avelox - inhaled steroid -pul toilet -agree with diuresis -reflux tx --may need allergy testing as outpatient  AFIB/CHF -per cards  ETOH use and Delirium -consider stopping or lower intake - monitor delirium in hospital esp now on steroids     r OPT FU with pulmonary with DR Marchelle Gearing  Dr. Kalman Shan, M.D., [facility].C.P Pulmonary and Critical Care Medicine Staff Physician [facility]monary and Critical Care Pager: [phone], If no answer or between  15:00h - 7:00h: call [phone]  07/01/2011 11:12 AM

## 2011-07-01 NOTE — Progress Notes (Addendum)
Subjective:  Dyspnea is improved.  On oral lasix. Weight is down 2 lb overnight. No chest pain. Trace edema.  Cough is looser. Seen by pulmonary yesterday. The patient has acute on chronic diastolic congestive heart failure.  Objective:  Vital Signs in the last 24 hours: Temp:  [97 F (36.1 C)-99.6 F (37.6 C)] 98.8 F (37.1 C) (04/24 0630) Pulse Rate:  [81-156] 96  (04/24 0749) Resp:  [18-24] 20  (04/24 0749) BP: (100-157)/(63-102) 118/64 mmHg (04/24 0749) SpO2:  [84 %-99 %] 95 % (04/24 0749) Weight:  [86.682 kg (191 lb 1.6 oz)] 86.682 kg (191 lb 1.6 oz) (04/24 0630)  Intake/Output from previous day: 04/23 0701 - 04/24 0700 In: 1373 [P.O.:1370; I.V.:3] Out: 1225 [Urine:1225] Intake/Output from this shift: Total I/O In: -  Out: 175 [Urine:175]     . allopurinol  300 mg Oral Daily  . antiseptic oral rinse  15 mL Mouth Rinse BID  . aspirin EC  81 mg Oral Daily  . atorvastatin  10 mg Oral QPC supper  . budesonide  0.25 mg Nebulization Q8H  . folic acid  1 mg Oral Daily  . furosemide  40 mg Oral BID  . ipratropium  0.5 mg Nebulization Q8H  . levalbuterol  0.63 mg Nebulization Q8H  . levofloxacin  500 mg Oral Daily  . metoprolol  50 mg Oral BID  . mulitivitamin with minerals  1 tablet Oral Daily  . nitroGLYCERIN  1 inch Topical Q6H  . pantoprazole  40 mg Oral BID AC  . predniSONE  40 mg Oral Q breakfast  . sodium chloride  3 mL Intravenous Q12H  . thiamine  100 mg Oral Daily   Or  . thiamine  100 mg Intravenous Daily  . warfarin  7.5 mg Oral ONCE-1800  . Warfarin - Pharmacist Dosing Inpatient   Does not apply q1800  . DISCONTD: folic acid  1 mg Oral Daily  . DISCONTD: furosemide  40 mg Intravenous BID  . DISCONTD: lisinopril  10 mg Oral Daily  . DISCONTD: pantoprazole  40 mg Oral Q1200  . DISCONTD: thiamine  100 mg Oral Daily  . DISCONTD: warfarin  2.5 mg Oral Custom      Physical Exam: The patient appears to be in no distress. Oriented this am. On CIWA  protocol.  Head and neck exam reveals that the pupils are equal and reactive.  The extraocular movements are full.  There is no scleral icterus.  Mouth and pharynx are benign.  No lymphadenopathy.  No carotid bruits.  The jugular venous pressure is difficult to see.  Thyroid is not enlarged or tender.  Chest is clear to percussion and auscultation.  Mild expiratory wheezing. Poor inspiratory effort.  Decreased breath sounds at bases.Bilateral rales worse on right.  Expansion of the chest is symmetrical.  Heart reveals no abnormal lift or heave.  First and second heart sounds are normal.  There is no  gallop rub or click. Gr 1/6 apical systolic murmur.  The abdomen is soft and nontender.  Bowel sounds are normoactive.  There is no hepatosplenomegaly or mass.  There are no abdominal bruits.  Extremities reveal no phlebitis.  Trace edema.  Pedal pulses are good.  There is no cyanosis or clubbing.  Neurologic exam is normal strength and no lateralizing weakness.  No sensory deficits. The acute delirium of last evening is resolved.  Integument reveals no rash  Lab Results:  Basename 07/01/11 0505 06/28/11 1924  WBC 8.4 6.7  HGB 13.8 15.0  PLT 102* 121*    Basename 06/30/11 0645 06/28/11 1924  NA 142 142  K 4.2 3.9  CL 102 100  CO2 31 32  GLUCOSE 103* 103*  BUN 37* 25*  CREATININE 1.34 1.38*    Basename 06/28/11 1925  TROPONINI <0.30   Hepatic Function Panel No results found for this basename: PROT,ALBUMIN,AST,ALT,ALKPHOS,BILITOT,BILIDIR,IBILI in the last 72 hours No results found for this basename: CHOL in the last 72 hours No results found for this basename: PROTIME in the last 72 hours  Imaging: Dg Chest 2 View  06/30/2011  *RADIOLOGY REPORT*  Clinical Data: Cough, dyspnea  CHEST - 2 VIEW  Comparison: Chest radiograph 06/28/2011  Findings: Stable enlarged cardiac silhouette.  No effusion, infiltrate, or pneumothorax.  There is central venous pulmonary congestion which is  slightly increased compared to prior. Degenerative osteophytosis of the thoracic spine.  The there is a stable 7 mm sclerotic/calcific lesion projecting over the thoracic spine which either represents a benign bone island or calcified pulmonary nodule.  IMPRESSION:  1.  Cardiomegaly with mild increased central venous congestion. 2.  Degenerative changes of the spine.  Original Report Authenticated By: Genevive Bi, M.D.    Cardiac Studies: Telemetry shows atrial fib with controlled ventricular response. 2D Echo showed normal LV systolic function, Mild MR, severe TR   Assessment/Plan:  Patient Active Hospital Problem List: Chronic atrial fibrillation (07/21/2010)   Assessment: Rate controlled   Plan:  continue BB Coronary artery disease ()   Assessment: Enzymes neg   Plan: Continue BB, statin, ASA Dyspnea ()   Assessment:Slightly improved this am   PFT results pending.  Recheck BNP in am    LOS: 3 days    Scott Reese 07/01/2011, 9:11 AM

## 2011-07-02 LAB — PROTIME-INR: Prothrombin Time: 18.9 seconds — ABNORMAL HIGH (ref 11.6–15.2)

## 2011-07-02 LAB — BASIC METABOLIC PANEL
BUN: 38 mg/dL — ABNORMAL HIGH (ref 6–23)
Calcium: 9.3 mg/dL (ref 8.4–10.5)
Creatinine, Ser: 1.15 mg/dL (ref 0.50–1.35)
GFR calc Af Amer: 67 mL/min — ABNORMAL LOW (ref >90)

## 2011-07-02 MED ORDER — IPRATROPIUM BROMIDE 0.02 % IN SOLN
0.5000 mg | Freq: Four times a day (QID) | RESPIRATORY_TRACT | Status: DC
  Administered 2011-07-02 – 2011-07-03 (×4): 0.5 mg via RESPIRATORY_TRACT
  Filled 2011-07-02 (×4): qty 2.5

## 2011-07-02 MED ORDER — WARFARIN SODIUM 7.5 MG PO TABS
7.5000 mg | ORAL_TABLET | Freq: Once | ORAL | Status: AC
  Administered 2011-07-02: 7.5 mg via ORAL
  Filled 2011-07-02: qty 1

## 2011-07-02 MED ORDER — LEVALBUTEROL HCL 0.63 MG/3ML IN NEBU
0.6300 mg | INHALATION_SOLUTION | Freq: Four times a day (QID) | RESPIRATORY_TRACT | Status: DC
  Administered 2011-07-02 – 2011-07-03 (×5): 0.63 mg via RESPIRATORY_TRACT
  Filled 2011-07-02 (×8): qty 3

## 2011-07-02 NOTE — Progress Notes (Signed)
Subjective:  Dyspnea is improved.  On oral lasix. Weight is unchanged. No chest pain. Trace edema.  Cough is looser. Seen by pulmonary. On IV steroids. The patient has acute on chronic diastolic congestive heart failure.  Objective:  Vital Signs in the last 24 hours: Temp:  [97.1 F (36.2 C)-99 F (37.2 C)] 97.9 F (36.6 C) (04/25 0658) Pulse Rate:  [77-92] 89  (04/25 0658) Resp:  [18] 18  (04/25 0658) BP: (111-136)/(59-74) 136/74 mmHg (04/25 0658) SpO2:  [90 %-98 %] 92 % (04/25 0658) Weight:  [87.499 kg (192 lb 14.4 oz)] 87.499 kg (192 lb 14.4 oz) (04/25 0658)  Intake/Output from previous day: 04/24 0701 - 04/25 0700 In: 816 [P.O.:816] Out: 400 [Urine:400] Intake/Output from this shift:       . allopurinol  300 mg Oral Daily  . antiseptic oral rinse  15 mL Mouth Rinse BID  . aspirin EC  81 mg Oral Daily  . atorvastatin  10 mg Oral QPC supper  . budesonide  0.25 mg Nebulization Q8H  . folic acid  1 mg Oral Daily  . furosemide  40 mg Oral BID  . ipratropium  0.5 mg Nebulization Q4H  . levalbuterol  0.63 mg Nebulization Q4H  . levofloxacin  500 mg Oral Daily  . methylPREDNISolone (SOLU-MEDROL) injection  80 mg Intravenous Q8H  . methylPREDNISolone sodium succinate      . metoprolol  50 mg Oral BID  . mulitivitamin with minerals  1 tablet Oral Daily  . nitroGLYCERIN  1 inch Topical Q6H  . sodium chloride  3 mL Intravenous Q12H  . thiamine  100 mg Oral Daily   Or  . thiamine  100 mg Intravenous Daily  . warfarin  7.5 mg Oral ONCE-1800  . Warfarin - Pharmacist Dosing Inpatient   Does not apply q1800  . DISCONTD: ipratropium  0.5 mg Nebulization Q8H  . DISCONTD: levalbuterol  0.63 mg Nebulization Q8H  . DISCONTD: pantoprazole  40 mg Oral BID AC  . DISCONTD: predniSONE  40 mg Oral Q breakfast  . DISCONTD: warfarin  2.5 mg Oral Custom      Physical Exam: The patient appears to be in no distress. Oriented this am. On CIWA protocol.  Head and neck exam reveals that  the pupils are equal and reactive.  The extraocular movements are full.  There is no scleral icterus.  Mouth and pharynx are benign.  No lymphadenopathy.  No carotid bruits.  The jugular venous pressure is difficult to see.  Thyroid is not enlarged or tender.  Chest:  Mild expiratory wheezing. Poor inspiratory effort.  Decreased breath sounds at bases.Bilateral rales worse on right.  Expansion of the chest is symmetrical.  Heart reveals no abnormal lift or heave.  First and second heart sounds are normal.  There is no  gallop rub or click. Gr 1/6 apical systolic murmur.  The abdomen is soft and nontender.  Bowel sounds are normoactive.  There is no hepatosplenomegaly or mass.  There are no abdominal bruits.  Extremities reveal no phlebitis.  Trace edema.  Pedal pulses are good.  There is no cyanosis or clubbing.  Neurologic exam is normal strength and no lateralizing weakness.  No sensory deficits. The acute delirium of last evening is resolved.  Integument reveals no rash  Lab Results:  Basename 07/01/11 0505  WBC 8.4  HGB 13.8  PLT 102*    Basename 07/02/11 0425 06/30/11 0645  NA 141 142  K 4.2 4.2  CL 99  102  CO2 31 31  GLUCOSE 146* 103*  BUN 38* 37*  CREATININE 1.15 1.34   No results found for this basename: TROPONINI:2,CK,MB:2 in the last 72 hours Hepatic Function Panel No results found for this basename: PROT,ALBUMIN,AST,ALT,ALKPHOS,BILITOT,BILIDIR,IBILI in the last 72 hours No results found for this basename: CHOL in the last 72 hours No results found for this basename: PROTIME in the last 72 hours  Imaging: Dg Chest 2 View  06/30/2011  *RADIOLOGY REPORT*  Clinical Data: Cough, dyspnea  CHEST - 2 VIEW  Comparison: Chest radiograph 06/28/2011  Findings: Stable enlarged cardiac silhouette.  No effusion, infiltrate, or pneumothorax.  There is central venous pulmonary congestion which is slightly increased compared to prior. Degenerative osteophytosis of the thoracic spine.   The there is a stable 7 mm sclerotic/calcific lesion projecting over the thoracic spine which either represents a benign bone island or calcified pulmonary nodule.  IMPRESSION:  1.  Cardiomegaly with mild increased central venous congestion. 2.  Degenerative changes of the spine.  Original Report Authenticated By: Genevive Bi, M.D.    Cardiac Studies: Telemetry shows atrial fib with controlled ventricular response. 2D Echo showed normal LV systolic function, Mild MR, severe TR   Assessment/Plan:  Patient Active Hospital Problem List: Chronic atrial fibrillation (07/21/2010)   Assessment: Rate controlled   Plan:  continue BB Coronary artery disease ()   Assessment: Enzymes neg   Plan: Continue BB, statin, ASA Dyspnea secondary to acute on chronic diastolic HF   Assessment:Slightly improved this am   PFT results pending.  BNP improved 1496.   Continue lasix. COPD    Clinically improving.  Presently on IV steroids  Plan:  Prob home Friday if he continues to improve.    LOS: 4 days    Cassell Clement 07/02/2011, 8:02 AM

## 2011-07-02 NOTE — Progress Notes (Signed)
ANTICOAGULATION CONSULT NOTE - Initial Consult  Pharmacy Consult for coumadin Indication: atrial fibrillation  No Known Allergies  Patient Measurements: Height: 5\' 8"  (172.7 cm) Weight: 192 lb 14.4 oz (87.499 kg) (Scale C) IBW/kg (Calculated) : 68.4    Vital Signs: Temp: 97.9 F (36.6 C) (04/25 0658) Temp src: Oral (04/25 0658) BP: 136/74 mmHg (04/25 0658) Pulse Rate: 89  (04/25 0658)  Labs:  Basename 07/02/11 0425 07/01/11 0505 06/30/11 0645  HGB -- 13.8 --  HCT -- 42.8 --  PLT -- 102* --  APTT -- -- --  LABPROT 18.9* 22.4* 26.4*  INR 1.55* 1.93* 2.38*  HEPARINUNFRC -- -- --  CREATININE 1.15 -- 1.34  CKTOTAL -- -- --  CKMB -- -- --  TROPONINI -- -- --   Estimated Creatinine Clearance: 54.2 ml/min (by C-G formula based on Cr of 1.15).    Assessment: 78 yo male with afib on coumadin PTA and INR has now fallen subtherapeutic at 1.55 possibly due to missed dose on 4/23. Of note patient continues on levaquin which can potentate the effects of warfarin. Will continue with higher dosing tonight.  Goal of Therapy:  INR 2-3   Plan:  Warfarin 7.5 today INR daily for now  Sheppard Coil PharmD 07/02/2011,10:05 AM

## 2011-07-02 NOTE — Progress Notes (Signed)
Name: Scott Reese MRN: [id number] DOB: [birth year]    LOS: 4 Requesting MD: Patty Sermons Regarding: COPD, Cough  ADMIT/ CONSULT NOTE  History of Present Illness: 78 yo WM , former heavy smoker (2ppd quit 20 years ago) who reports increasing sob over 2 years period. He presents 4/21 with increased SOB over his baseline, + chill x 1 , along with non productive x 3 days. Of note he further describes allergic sxs over last 2 days (watery eyes, mild wheezes). He is notable on admit for poorly rate controlled afib. PCCM asked to evaluate and assist with SOB,ote he lives with wife and 13 cats.  Lines / Drains:   Cultures: none  Antibiotics: none  Tests / Events: 4/24 - Spirometry shows gold stage 4 copd  Subjective:  Sitting in chair with no increased wob. But still says not feeling better and feels unable to bring mucus. Had tachycardia to 180s when he coughed but already on xopenex. Otherwise no complaints   Vital Signs: Temp:  [97.7 F (36.5 C)-99 F (37.2 C)] 97.9 F (36.6 C) (04/25 0658) Pulse Rate:  [77-92] 89  (04/25 1016) Resp:  [18] 18  (04/25 0658) BP: (111-146)/(59-74) 146/70 mmHg (04/25 1016) SpO2:  [90 %-98 %] 94 % (04/25 0826) Weight:  [87.499 kg (192 lb 14.4 oz)] 87.499 kg (192 lb 14.4 oz) (04/25 0658) I/O last 3 completed shifts: In: 1656 [P.O.:1656] Out: 1350 [Urine:1350]  Physical Examination: General: WNWDWM NAD @rest  Neuro:  intact  HEENT:  No jvd Cardiovascular:  hsir ri Lungs:  Decreased bases, no wheeze, congested cough Abdomen:  +bs Musculoskeletal:  intact Skin:  warm      Labs and Imaging:  No results found.  Lab 07/02/11 0425 06/30/11 0645 06/28/11 1924  NA 141 142 142  K 4.2 4.2 3.9  CL 99 102 100  CO2 31 31 32  BUN 38* 37* 25*  CREATININE 1.15 1.34 1.38*  GLUCOSE 146* 103* 103*     Lab 07/01/11 0505 06/28/11 1924  HGB 13.8 15.0  HCT 42.8 46.1  WBC 8.4 6.7  PLT 102* 121*   BNP (last 3 results)  Basename 07/02/11 0425 07/01/11 0850 06/28/11 1925  PROBNP 1496.0* 2388.0* 2997.0*   ABG    Component Value Date/Time   PHART 7.411 06/30/2011 1300   No results found.  Echo - ef 55% severe TR  IgE 12.4  Spirometry - gold stage 4 copd  Assessment and Plan: Cough and increased SOB, superimposed over 2 year decline in SOB with ADL's. ? Component of allergic component with pollen and cat dander along with copd but IgE normal.  Afib with RVR may have worsened SOB.  4/24 better with negative i/o  A: AECOPD is likely dx. He appears to have very severe copd - agree with xopenex and atrovent but increase frequency  - conntinue IV solumedrol; will  change to prednisone 07/03/11 - continue po avelox; total 5 days - neb  Steroid - continue mucinex -pul toilet -agree with diuresis -reflux tx --may need allergy testing as outpatient - OPD FU with pulmonary with DR Marchelle Gearing to be set up  AFIB/CHF -per cards  ETOH use and Delirium -- monitor delirium in hospital esp now on steroids; none on 4.24 - 4.25       Dr. Kalman Shan, M.D., [facility]C.P Pulmonary and Critical Care Medicine Staff Physician [facility] [place] [facility]monary and Critical Care Pager: [phone], If no answer or between  15:00h - 7:00h: call [phone]  07/02/2011 11:02 AM

## 2011-07-03 ENCOUNTER — Encounter (HOSPITAL_COMMUNITY): Payer: Self-pay | Admitting: Physician Assistant

## 2011-07-03 LAB — CBC
HCT: 41.5 % (ref 39.0–52.0)
Hemoglobin: 13.8 g/dL (ref 13.0–17.0)
MCHC: 33.3 g/dL (ref 30.0–36.0)
MCV: 90.4 fL (ref 78.0–100.0)
RDW: 15.3 % (ref 11.5–15.5)
WBC: 10.5 10*3/uL (ref 4.0–10.5)

## 2011-07-03 LAB — PROTIME-INR: INR: 2.28 — ABNORMAL HIGH (ref 0.00–1.49)

## 2011-07-03 LAB — BASIC METABOLIC PANEL
BUN: 43 mg/dL — ABNORMAL HIGH (ref 6–23)
CO2: 31 mEq/L (ref 19–32)
Calcium: 9.2 mg/dL (ref 8.4–10.5)
GFR calc non Af Amer: 51 mL/min — ABNORMAL LOW (ref >90)
Glucose, Bld: 155 mg/dL — ABNORMAL HIGH (ref 70–99)
Sodium: 140 mEq/L (ref 135–145)

## 2011-07-03 MED ORDER — WARFARIN SODIUM 5 MG PO TABS: ORAL_TABLET | ORAL | Status: DC

## 2011-07-03 MED ORDER — BUDESONIDE 0.25 MG/2ML IN SUSP: 0.2500 mg | Freq: Two times a day (BID) | RESPIRATORY_TRACT | Status: DC

## 2011-07-03 MED ORDER — BUDESONIDE 0.25 MG/2ML IN SUSP: 0.2500 mg | Freq: Three times a day (TID) | RESPIRATORY_TRACT | Status: DC

## 2011-07-03 MED ORDER — PREDNISONE 20 MG PO TABS
40.0000 mg | ORAL_TABLET | Freq: Every day | ORAL | Status: DC
  Filled 2011-07-03: qty 2

## 2011-07-03 MED ORDER — IPRATROPIUM BROMIDE 0.02 % IN SOLN: 0.5000 mg | Freq: Four times a day (QID) | RESPIRATORY_TRACT | Status: DC

## 2011-07-03 MED ORDER — ALBUTEROL SULFATE (2.5 MG/3ML) 0.083% IN NEBU: 2.5000 mg | INHALATION_SOLUTION | Freq: Four times a day (QID) | RESPIRATORY_TRACT | Status: DC

## 2011-07-03 MED ORDER — ADULT MULTIVITAMIN W/MINERALS CH: 1.0000 | ORAL_TABLET | Freq: Every day | ORAL | Status: DC

## 2011-07-03 MED ORDER — ASPIRIN 81 MG PO TBEC: 81.0000 mg | DELAYED_RELEASE_TABLET | Freq: Every day | ORAL | Status: DC

## 2011-07-03 MED ORDER — PREDNISONE 10 MG PO TABS: ORAL_TABLET | ORAL | Status: DC

## 2011-07-03 MED ORDER — LEVOFLOXACIN 500 MG PO TABS: 500.0000 mg | ORAL_TABLET | Freq: Every day | ORAL | Status: DC

## 2011-07-03 MED ORDER — GUAIFENESIN ER 600 MG PO TB12: 600.0000 mg | ORAL_TABLET | Freq: Two times a day (BID) | ORAL | Status: AC | PRN

## 2011-07-03 MED ORDER — WARFARIN SODIUM 1 MG PO TABS
1.0000 mg | ORAL_TABLET | Freq: Once | ORAL | Status: DC
  Filled 2011-07-03: qty 1

## 2011-07-03 NOTE — Progress Notes (Signed)
Name: Scott Reese MRN: [id number] DOB: Oct 06, 1927    LOS: 5 days Requesting MD: Patty Sermons Regarding: COPD, Cough  ADMIT/ CONSULT NOTE  History of Present Illness: 78 yo WM , former heavy smoker (2ppd quit 20 years ago) who reports increasing sob over 2 years period. He presents 4/21 with increased SOB over his baseline, + chill x 1 , along with non productive x 3 days. Of note he further describes allergic sxs over last 2 days (watery eyes, mild wheezes). He is notable on admit for poorly rate controlled afib. PCCM asked to evaluate and assist with SOB,ote he lives with wife and 13 cats.  Lines / Drains:   Cultures: none  Antibiotics: none  Tests / Events: 4/24 - Spirometry shows gold stage 4 copd  Subjective:  Sitting in chair with no increased wob. Feels he is getting mucus out and feels better. Still having periodic Non sustained SVTs esp with cough. Wants to go home  Vital Signs: Temp:  [97.2 F (36.2 C)-97.7 F (36.5 C)] 97.5 F (36.4 C) (04/26 0645) Pulse Rate:  [80-101] 80  (04/26 0603) Resp:  [18-19] 18  (04/26 0645) BP: (127-153)/(70-82) 150/81 mmHg (04/26 0603) SpO2:  [94 %-96 %] 95 % (04/26 0645) FiO2 (%):  [28 %] 28 % (04/25 1246) Weight:  [88.4 kg (194 lb 14.2 oz)] 88.4 kg (194 lb 14.2 oz) (04/26 0645) I/O last 3 completed shifts: In: 1639 [P.O.:1636; I.V.:3] Out: 925 [Urine:925]  Physical Examination: General: WNWDWM NAD @rest  Neuro:  intact  HEENT:  No jvd Cardiovascular:  hsir ri Lungs:  Decreased bases, no wheeze, congested cough Abdomen:  +bs Musculoskeletal:  intact Skin:  warm   Vent Mode:  [-]  FiO2 (%):  [28 %] 28 %  Labs and Imaging:  No results found.  Lab 07/03/11 0514 07/02/11 0425 06/30/11 0645  NA 140 141 142  K 4.0 4.2 4.2  CL 98 99 102  CO2 31 31 31   BUN 43* 38* 37*    CREATININE 1.27 1.15 1.34  GLUCOSE 155* 146* 103*    Lab 07/03/11 0514 07/01/11 0505 06/28/11 1924  HGB 13.8 13.8 15.0  HCT 41.5 42.8 46.1  WBC 10.5 8.4 6.7  PLT 122* 102* 121*   BNP (last 3 results)  Basename 07/02/11 0425 07/01/11 0850 06/28/11 1925  PROBNP 1496.0* 2388.0* 2997.0*   ABG    Component Value Date/Time   PHART 7.411 06/30/2011 1300   No results found.  Echo - ef 55% severe TR  IgE 12.4  Spirometry - gold stage 4 copd  Assessment and Plan: Cough and increased SOB, superimposed over 2 year decline in SOB with ADL's. ? Component of allergic component with pollen and cat dander along with copd but IgE normal.  Afib with RVR may have worsened SOB.  4/24 better with negative i/o  A: AECOPD is dx.  He appears to have very severe copd at baseline - agree with xopenex and atrovent at q6h scheduled (if he can go home on albuterol q6h instead of xopenex preferred due to cost, but wil let cards decide)  - change to prednisone 07/03/11 at 40mg  per day and taper over 2 weeks    Please take Take prednisone 40mg  once daily x 3 days, then 30mg  once daily x 3 days, then 20mg  once daily x 3 days, then prednisone 10mg  once daily  x 3 days and then 5mg  prednisone daily x  3 days and  stop  - continue po avelox; total 5 days - neb  Steroid pulmicort 0.25mg ./mL bid - continue mucinex -pul toilet -agree with diuresis -reflux tx --may need allergy testing as outpatient due to cat exposure but IgE normal: Dr Marchelle Gearing will set up - OPD FU with pulmonary with DR Raeqwon Lux: 07/09/11 at 2pm. Will do sp[facility]rom[facility] at [facility]               Dr. Kalman Shan, M.D., [facility].C.P Pulmonary and Critical Care Medicine Staff Physician [facility]  Pulmonary and Critical Care Pager: [phone], If no answer or between  15:00h - 7:00h: call [phone]  07/03/2011 9:46 AM

## 2011-07-03 NOTE — Progress Notes (Signed)
All d/c instructions explained and given to pt. And spouse.  Veebalized understanding.  Amanda Pea, RN

## 2011-07-03 NOTE — Progress Notes (Signed)
Subjective:  Dyspnea is improved.  On oral lasix. Weight is stable. No chest pain. Trace edema.  Cough is looser. Seen by pulmonary. On IV steroids. The patient has acute on chronic diastolic congestive heart failure. Mentation is clear.  No sign of withdrawal symptoms now.  Objective:  Vital Signs in the last 24 hours: Temp:  [97.2 F (36.2 C)-97.7 F (36.5 C)] 97.5 F (36.4 C) (04/26 0645) Pulse Rate:  [80-101] 80  (04/26 0603) Resp:  [18-19] 18  (04/26 0645) BP: (127-153)/(70-82) 150/81 mmHg (04/26 0603) SpO2:  [94 %-96 %] 95 % (04/26 0645) FiO2 (%):  [28 %] 28 % (04/25 1246) Weight:  [88.4 kg (194 lb 14.2 oz)] 88.4 kg (194 lb 14.2 oz) (04/26 0645)  Intake/Output from previous day: 04/25 0701 - 04/26 0700 In: 1283 [P.O.:1280; I.V.:3] Out: 700 [Urine:700] Intake/Output from this shift:       . allopurinol  300 mg Oral Daily  . antiseptic oral rinse  15 mL Mouth Rinse BID  . aspirin EC  81 mg Oral Daily  . atorvastatin  10 mg Oral QPC supper  . budesonide  0.25 mg Nebulization Q8H  . folic acid  1 mg Oral Daily  . furosemide  40 mg Oral BID  . ipratropium  0.5 mg Nebulization Q6H  . levalbuterol  0.63 mg Nebulization Q6H  . levofloxacin  500 mg Oral Daily  . methylPREDNISolone (SOLU-MEDROL) injection  80 mg Intravenous Q8H  . metoprolol  50 mg Oral BID  . mulitivitamin with minerals  1 tablet Oral Daily  . nitroGLYCERIN  1 inch Topical Q6H  . sodium chloride  3 mL Intravenous Q12H  . thiamine  100 mg Oral Daily   Or  . thiamine  100 mg Intravenous Daily  . warfarin  7.5 mg Oral ONCE-1800  . Warfarin - Pharmacist Dosing Inpatient   Does not apply q1800  . DISCONTD: ipratropium  0.5 mg Nebulization Q4H  . DISCONTD: levalbuterol  0.63 mg Nebulization Q4H      Physical Exam: The patient appears to be in no distress. Oriented this am.   Head and neck exam reveals that the pupils are equal and reactive.  The extraocular movements are full.  There is no scleral  icterus.  Mouth and pharynx are benign.  No lymphadenopathy.  No carotid bruits.  The jugular venous pressure is difficult to see.  Thyroid is not enlarged or tender.  Chest: Coarse rhonchi bilaterally.  Heart reveals no abnormal lift or heave.  First and second heart sounds are normal.  There is no  gallop rub or click. Gr 1/6 apical systolic murmur.  The abdomen is soft and nontender.  Bowel sounds are normoactive.  There is no hepatosplenomegaly or mass.  There are no abdominal bruits.  Extremities reveal no phlebitis.  Trace edema.  Pedal pulses are good.  There is no cyanosis or clubbing.  Neurologic exam is normal strength and no lateralizing weakness.  No sensory deficits. The acute delirium of last evening is resolved.  Integument reveals no rash  Lab Results:  Basename 07/03/11 0514 07/01/11 0505  WBC 10.5 8.4  HGB 13.8 13.8  PLT 122* 102*    Basename 07/03/11 0514 07/02/11 0425  NA 140 141  K 4.0 4.2  CL 98 99  CO2 31 31  GLUCOSE 155* 146*  BUN 43* 38*  CREATININE 1.27 1.15   No results found for this basename: TROPONINI:2,CK,MB:2 in the last 72 hours Hepatic Function Panel No results  found for this basename: PROT,ALBUMIN,AST,ALT,ALKPHOS,BILITOT,BILIDIR,IBILI in the last 72 hours No results found for this basename: CHOL in the last 72 hours No results found for this basename: PROTIME in the last 72 hours  Imaging: No results found.  Cardiac Studies: Telemetry shows atrial fib with controlled ventricular response. 2D Echo showed normal LV systolic function, Mild MR, severe TR   Assessment/Plan:  Patient Active Hospital Problem List: Chronic atrial fibrillation (07/21/2010)   Assessment: Rate controlled   Plan:  continue BB Coronary artery disease ()   Assessment: Enzymes neg   Plan: Continue BB, statin, ASA Dyspnea secondary to acute on chronic diastolic HF   Assessment:Slightly improved this am   PFT results pending.  BNP improved 1496.   Continue  lasix. COPD    Clinically improving.  Presently on IV steroids. Pulmonary to switch to oral steroid taper today.  Plan:  Home today after seen by pulmonary.       Will need home oxygen.       He should keep appointment in coumadin clinic scheduled for Monday 07/06/11       He declines home health nurse.       Followup with Dr. Patty Sermons or Lawson Fiscal 1 week.       Pulmonary followup with Dr. Marchelle Gearing as per his note of 07/02/11       Continue current cardiac meds.    LOS: 5 days    Scott Reese 07/03/2011, 8:04 AM

## 2011-07-03 NOTE — Progress Notes (Addendum)
Pt's 02 sat 92% on RA.  Kim HF nurse notified.  Amanda Pea, RN

## 2011-07-03 NOTE — Plan of Care (Signed)
Problem: Phase I Progression Outcomes Goal: EF % per last Echo/documented,Core Reminder form on chart Outcome: Completed/Met Date Met:  07/03/11 55-60%

## 2011-07-03 NOTE — Progress Notes (Signed)
Pt forget coumadin paper in chart,  Danna PA aware and instructed nurse it was ok.  Amanda Pea, RN>

## 2011-07-03 NOTE — Plan of Care (Signed)
Problem: Phase II Progression Outcomes Goal: Dyspnea controlled with activity Outcome: Completed/Met Date Met:  07/03/11 P[t d/c with nebulizer

## 2011-07-03 NOTE — Progress Notes (Signed)
Pt HR 140's while up in chair in his room.  Danna, PA notified and gve EKG as well.  Noted pt been have HR 140's and said pulmonary to see this am, before going home.  Amanda Pea, Charity fundraiser.

## 2011-07-03 NOTE — Progress Notes (Signed)
   CARE MANAGEMENT NOTE HEART FAILURE  07/03/2011   Patient:  Scott Reese, Scott Reese   Account Number:  [id number]    Date Initiated:  06/29/2011  Documentation initiated by:  Tera Mater  Subjective/Objective Assessment:   78yo male admitted with SOB and cough.  Pt. reports that he has not been taking his medications past week.  HX:  AFIB, HTN, COPD, CAD.  Lives w/spouse.   Action/Plan:   Discharge planning for [facility] RN for HF managment   Anticipated DC Date:  07/02/2011  Anticipated DC Plan:  HOME W HOME HEALTH SERVICES  DC Planning Services:  CM consult    PAC Choice:  DURABLE MEDICAL EQUIPMENT  HOME HEALTH   Choice offered to / List presented to:  C-1 Patient DME arranged:  NEBULIZER MACHINE     DME agency:  [facility]   [facility] arranged:  HH-11 PATIENT REFUSED      Status of service:  Completed, signed off  Medicare Important Message Given:  NA - LOS <3 / Initial given by admissions (If response is "NO", the following Medicare IM given date fields will be blank) Date Medicare IM Given:   Date Additional Medicare IM Given:    Discharge Disposition:  HOME/SELF CARE  Per UR Regulation:  Reviewed for med. necessity/level of care/duration of stay  If discussed at Long Length of Stay Meetings, dates discussed:    Comments:   06/29/11 1000 UR Completed. Tera Mater, RN, BSN Nurse Case Manager [phone]   Initial CM contact:  06/30/2011 11:30 AM  By:  Tera Mater Initial CSW contact:     By:      Is this an INP Readmission < 30 days:  N (If "YES" please see readmission information at the bottom of note)  Patient living status prior to this admission:  FAMILY  Patient setting prior to this admission:  HOME  Comorbid conditions being treated that contributed to this admission:  COPD, CAD, AFIB, HTN  CHF Readmission Risk:  high  Type of patient education provided  HF Patient Education Assessment / Teach Back  HF Zone Tool / Magnet  Limit salt  intake  Weigh daily     Patient education provided by  [facility]    Was referral made to [facility]:  N  Is the patient's PCP the same as attending:  N PCP:    Readmission < 30 Days If pt has HH, did they contact the agency before going to the ED:   Name of [facility] agency:    Was the follow-up physician visit scheduled prior to discharge:    Did the patient follow-up with the physician prior to this readmission:    Was there HF Clinic visits prior to readmission:    Were there ED visits between admissions:    Readmit type:    If unscheduled and related indicate reason for readmit:

## 2011-07-03 NOTE — Discharge Summary (Signed)
Discharge Summary   Patient ID: Scott Reese MRN: [id number], DOB/AGE: 76-Jun-1929 76 y.o. Admit date: 06/28/2011 D/C date:     07/03/2011   Primary Discharge Diagnoses:  1. Severe COPD with acute exacerbation 2. Acute on chronic diastolic CHF 3. Chronic atrial fibrillation, on Coumadin 4. HTN 5. Transient delirium felt secondary to EtOH withdrawal vs steroids 6. Acute respiratory failure, improved (hypoxia on ambulation during hospitalization, was up to 96% on RA at end of hospitalization)  Secondary Discharge Diagnoses:  1. CAD - 40-50% LCx by cath 2004 2. Dyslipidemia 3. GERD  Hospital Course: 78 y/o M with hx of chronic afib, CAD who presented to [facility] with complaints of worsening DOE, SOB, and orthopnea, mild LEE. In the ER he was given several nebulizer treatments without relief. Chest x-ray was a poor film, which showed left lower lobe scarring and bibasilar atelectasis. INR was therapeutic. EKG showed atrial fibrillation with rapid ventricular rate of 113 beats per minute with right bundle- branch block. It was felt his presentation was most consistent with acute CHF. He was also hypertensive with BP 183/81 at time of cardiology evaluation. INR was therapeutic. He was admitted for diuresis with IV Lasix as well as medication titration including addition of lisinopril. He was also on IV diltiazem in the ER, which was eventually transitioned back to his beta blocker. 2D echo was performed demonstrating fidnings below with normal EF, mild AR, mild MR, moderately dilated LA, mod-severely dilated RA, and severe TR. Because of complaint of severe dry cough, ACEI was stopped. Pulmonary was consulted who felt his presentation was also consistent with acute exacerbation of severe COPD, with possible component of allergic component with pollen and cat dander (lives with 13 cats). They recommended nebs, cough suppression, inhaled steroids, antibiotics, and felt he may eventually need  allergy testing although IgE was normal. On 06/30/11 he took off his oxygen and went walking without O2, then became increasingly confused/agitated and was found to be hypoxic at 84% requiring reapplication of O2 with improvement in symptoms. Dr. Patty Sermons felt this may represent a component of alcohol withdrawal and ordered the CIWA protocol, and pulmonary recommended continued monitoring given steroid therapy. He was transitioned to PO Lasix. He did not require any potassium supplementation. On 4/26 his mental status had cleared. He is feeling better. His baseline HR is in the upper 90s/low 100s but per Dr. Patty Sermons he tolerates this. He has seen and examined the patient today and feels he is stable for discharge; Dr. Marchelle Gearing has also seen the patient today - we discussed recommendations regarding nebulizer treatment, prednisone taper, & completion of 5-day antibiotic course (levaquin). The patient will be followed closely at followup. He ambulated today with one of our cardiology PAs and remained 96% on RA, was 92% on RA with sitting so will not need home O2 this admission. He declines HHRN.  I discussed his dosing of Coumadin with pharmacy, and they have recommended 1mg  today, 1mg  tomorrow, 2.5mg  on Sunday then recheck INR on Monday. Because he only has 5mg  tablets at home, they will be supplying him with a 2mg  Coumadin tablet to take home over the weekend for the 1mg  dose today and Saturday.   Discharge Vitals: Blood pressure 150/81, pulse 80, temperature 97.5 F (36.4 C), temperature source Oral, resp. rate 18, height 5\' 8"  (1.727 m), weight 194 lb 14.2 oz (88.4 kg), SpO2 95.00%.  Labs: Lab Results  Component Value Date   WBC 10.5 07/03/2011   HGB  13.8 07/03/2011   HCT 41.5 07/03/2011   MCV 90.4 07/03/2011   PLT 122* 07/03/2011     Lab 07/03/11 0514  NA 140  K 4.0  CL 98  CO2 31  BUN 43*  CREATININE 1.27  CALCIUM 9.2  PROT --  BILITOT --  ALKPHOS --  ALT --  AST --  GLUCOSE 155*     Lab Results  Component Value Date   CHOL 154 03/18/2011   HDL 39.50 03/18/2011   TRIG 454.0* 03/18/2011    Diagnostic Studies/Procedures   1. Chest 2 View 06/30/2011  *RADIOLOGY REPORT*  Clinical Data: Cough, dyspnea  CHEST - 2 VIEW  Comparison: Chest radiograph 06/28/2011  Findings: Stable enlarged cardiac silhouette.  No effusion, infiltrate, or pneumothorax.  There is central venous pulmonary congestion which is slightly increased compared to prior. Degenerative osteophytosis of the thoracic spine.  The there is a stable 7 mm sclerotic/calcific lesion projecting over the thoracic spine which either represents a benign bone island or calcified pulmonary nodule.  IMPRESSION:  1.  Cardiomegaly with mild increased central venous congestion. 2.  Degenerative changes of the spine.  Original Report Authenticated By: Genevive Bi, M.D.   2. Chest 2 View 06/28/2011  *RADIOLOGY REPORT*  Clinical Data: Cough and congestion, shortness of breath  CHEST - 2 VIEW  Comparison: 12/16/2010  Findings: Cardiac leads obscure detail.  Moderate enlargement of the cardiomediastinal silhouette is present of the central vascular congestion but no overt edema.  Left lower lobe scarring is stable. Apparent retrocardiac opacity on the frontal view is likely due to hypoaeration and summation of shadows due to positioning, based on review of the lateral image.  No pneumothorax.  No acute osseous finding. The spine is not well evaluated on the lateral projection due to habitus and technique.  IMPRESSION: Left lower lobe scarring again noted.  Low volumes with bibasilar atelectasis but no focal acute finding otherwise. If the patient's symptoms continue, consider PA and lateral chest radiographs obtained at full inspiration when the patient is clinically able.  Original Report Authenticated By: Harrel Lemon, M.D.   3. 2D Echo 06/29/11 Study Conclusions - Left ventricle: The cavity size was normal. Wall thickness was normal.  Systolic function was normal. The estimated ejection fraction was in the range of 55% to 60%.  - Aortic valve: Mild regurgitation. - Mitral valve: Mild regurgitation. - Left atrium: The atrium was moderately dilated. - Right atrium: The atrium was moderately to severely dilated. - Atrial septum: No defect or patent foramen ovale was identified. - Tricuspid valve: Severe regurgitation  Discharge Medications   Medication List  As of 07/03/2011  2:01 PM   STOP taking these medications         potassium chloride SA 20 MEQ tablet         TAKE these medications         albuterol (2.5 MG/3ML) 0.083% nebulizer solution   Commonly known as: PROVENTIL   Take 3 mLs (2.5 mg total) by nebulization every 6 (six) hours. May also use every 3 hours in between scheduled treatments as needed for shortness of breath or wheezing. Max = 5 treatments per day. If you are having to use more than that, contact your pulmonary doctor.      allopurinol 300 MG tablet   Commonly known as: ZYLOPRIM   Take 300 mg by mouth daily.      aspirin 81 MG EC tablet   Take 1 tablet (81 mg total) by  mouth daily.      atorvastatin 10 MG tablet   Commonly known as: LIPITOR   Take 10 mg by mouth daily.      budesonide 0.25 MG/2ML nebulizer solution   Commonly known as: PULMICORT   Take 2 mLs (0.25 mg total) by nebulization 2 (two) times daily.      furosemide 40 MG tablet   Commonly known as: LASIX   Take 40 mg by mouth 2 (two) times daily.      guaiFENesin 600 MG 12 hr tablet   Commonly known as: MUCINEX   Take 1 tablet (600 mg total) by mouth 2 (two) times daily as needed for congestion.      HYDROcodone-acetaminophen 5-325 MG per tablet   Commonly known as: NORCO   Take 1 tablet by mouth daily as needed. For pain.      ipratropium 0.02 % nebulizer solution   Commonly known as: ATROVENT   Take 2.5 mLs (0.5 mg total) by nebulization every 6 (six) hours.      levofloxacin 500 MG tablet   Commonly known as:  LEVAQUIN   Take 1 tablet (500 mg total) by mouth daily. You only have 1 tablet left to take tomorrow to finish your course of antbiotics.      metoprolol 100 MG tablet   Commonly known as: LOPRESSOR   Take 50 mg by mouth 2 (two) times daily.      mulitivitamin with minerals Tabs   Take 1 tablet by mouth daily.      nitroGLYCERIN 0.4 MG SL tablet   Commonly known as: NITROSTAT   Place 0.4 mg under the tongue every 5 (five) minutes as needed. For chest pain.      omeprazole 40 MG capsule   Commonly known as: PRILOSEC   Take 40 mg by mouth daily.      predniSONE 10 MG tablet   Commonly known as: DELTASONE   By mouth: Take 4 tablets once daily for 3 days, then 3 tablets once daily for 3 days, then 2 tablets once daily for 3 days, then 1 tablet once daily for 3 days, then 1/2 tablet once daily for 3 days then stop.      warfarin 5 MG tablet   Commonly known as: COUMADIN   SPECIAL INSTRUCTIONS: At discharge, you were given one 2mg  tablet of Coumadin by the hospital. You will take 1/2 tablet of this tonight (07/03/11) and the other 1/2 tomorrow (07/04/11). On Sunday, you will go back to taking 1/2 tablet of your regular 5mg  pill. On Monday your Coumadin level will be checked to see if your dose needs adjusting.            Disposition   The patient will be discharged in stable condition to home. Discharge Orders    Future Appointments: Provider: Department: Dept Phone: Center:   07/06/2011 10:15 AM [facility] [phone] None   07/09/2011 2:00 PM Kalman Shan, MD [facility] [phone] None   07/15/2011 10:00 AM Rosalio Macadamia, NP [facility] [phone] None   09/28/2011 10:00 AM Cassell Clement, MD [facility] [phone] None   09/28/2011 10:15 AM [facility] [phone] [facility]     Future Orders Please Complete By Expires   Diet - low sodium heart healthy      Increase activity slowly       Discharge instructions      Comments:   Your doctor has recommended cutting down and stopping  alcohol use.     Follow-up Information    Follow up with [facility], MD. (07/09/11 at 2pm)    Contact information:   [address] [place] [address] [phone]       Follow up with [facility]. ([facility] 07/06/11 at 10:15am)    Contact information:   [address] [place] [phone]      Follow up with Norma Fredrickson, NP. (You will see Norma Fredrickson NP at Dr. Yevonne Pax office 07/15/11 at 10am)    Contact information:   [address], [address] [place] [address] [phone]            Duration of Discharge Encounter: 1 hour 15 minutes including physician and PA time.  Signed, Kriste Basque Asiana Benninger PA-C 07/03/2011, 2:01 PM

## 2011-07-03 NOTE — Progress Notes (Signed)
ANTICOAGULATION CONSULT NOTE - Initial Consult  Pharmacy Consult for coumadin Indication: atrial fibrillation  No Known Allergies  Patient Measurements: Height: 5\' 8"  (172.7 cm) Weight: 194 lb 14.2 oz (88.4 kg) (Scale C) IBW/kg (Calculated) : 68.4    Vital Signs: Temp: 97.5 F (36.4 C) (04/26 0645) Temp src: Oral (04/26 0645) BP: 150/81 mmHg (04/26 0603) Pulse Rate: 80  (04/26 0603)  Labs:  Basename 07/03/11 0514 07/02/11 0425 07/01/11 0505  HGB 13.8 -- 13.8  HCT 41.5 -- 42.8  PLT 122* -- 102*  APTT -- -- --  LABPROT 25.5* 18.9* 22.4*  INR 2.28* 1.55* 1.93*  HEPARINUNFRC -- -- --  CREATININE 1.27 1.15 --  CKTOTAL -- -- --  CKMB -- -- --  TROPONINI -- -- --   Estimated Creatinine Clearance: 49.3 ml/min (by C-G formula based on Cr of 1.27).    Assessment: 78 yo male with afib on coumadin PTA. INR has been rather eratic as dosing has also not been very consistent. INR is now therapeutic this morning, 1.5>>2.2, no bleeding issues noted, patient continues on levaquin which can be contributing to these fluctuations. Will give low dose today, INR likely to continue to rise but patient schedule for lower doses this weekend.  Goal of Therapy:  INR 2-3   Plan:  Warfarin 1 today INR daily for now  Sheppard Coil PharmD 07/03/2011,10:06 AM

## 2011-07-06 ENCOUNTER — Telehealth: Payer: Self-pay | Admitting: Internal Medicine

## 2011-07-06 ENCOUNTER — Ambulatory Visit (INDEPENDENT_AMBULATORY_CARE_PROVIDER_SITE_OTHER): Payer: Medicare Other

## 2011-07-06 DIAGNOSIS — I4891 Unspecified atrial fibrillation: Secondary | ICD-10-CM

## 2011-07-06 DIAGNOSIS — J449 Chronic obstructive pulmonary disease, unspecified: Secondary | ICD-10-CM

## 2011-07-06 DIAGNOSIS — I480 Paroxysmal atrial fibrillation: Secondary | ICD-10-CM

## 2011-07-06 LAB — POCT INR: INR: 2.6

## 2011-07-06 NOTE — Telephone Encounter (Signed)
Pt was seen in hospital by Dr. Marchelle Gearing and instructed to take pulmicort, albuterol, and atrovent nebs. Pt has HFU on 07-09-11 with MR. Order placed for [facility] to provide neb and meds.Carron Curie, CMA

## 2011-07-06 NOTE — Telephone Encounter (Signed)
I spoke with pt and he states that he wants to get his albuterol nebs filled through [facility] that was given to him by MR from the hospital. I advised pt will have to call [facility] in the AM since they were closed now. He was fine with this. I advised him will call him once this is taken care of tomorrow. Will hold in triage to call in am

## 2011-07-07 ENCOUNTER — Other Ambulatory Visit: Payer: Self-pay | Admitting: Internal Medicine

## 2011-07-07 MED ORDER — IPRATROPIUM BROMIDE 0.02 % IN SOLN: 0.5000 mg | Freq: Four times a day (QID) | RESPIRATORY_TRACT | Status: DC

## 2011-07-07 MED ORDER — BUDESONIDE 0.25 MG/2ML IN SUSP: 0.2500 mg | Freq: Two times a day (BID) | RESPIRATORY_TRACT | Status: DC

## 2011-07-07 MED ORDER — ALBUTEROL SULFATE (2.5 MG/3ML) 0.083% IN NEBU: 2.5000 mg | INHALATION_SOLUTION | Freq: Four times a day (QID) | RESPIRATORY_TRACT | Status: DC

## 2011-07-07 NOTE — Telephone Encounter (Signed)
Per duplicate phone message from yesterday, order has already been placed to [facility] for neb machine and neb meds.    Called, spoke with pt. I informed him of this.  Pt states he does not need the neb machine as he already has one.  He just needs the neb meds.  He would like [place] to call [facility] to advise them of this and call him back so he is aware this has been taken care of.    Spoke with [place], [facility].  She has not yet sent this to [facility].  She will inform [facility] that pt does NOT need the neb machine -- he only need the neb meds.  Pt is aware of this.

## 2011-07-09 ENCOUNTER — Encounter: Payer: Self-pay | Admitting: Internal Medicine

## 2011-07-09 ENCOUNTER — Ambulatory Visit (INDEPENDENT_AMBULATORY_CARE_PROVIDER_SITE_OTHER): Payer: Medicare Other | Admitting: Internal Medicine

## 2011-07-09 VITALS — BP 118/78 | HR 80 | Temp 97.6°F | Ht 68.0 in | Wt 196.4 lb

## 2011-07-09 DIAGNOSIS — J449 Chronic obstructive pulmonary disease, unspecified: Secondary | ICD-10-CM

## 2011-07-09 NOTE — Patient Instructions (Addendum)
Your numbers are better Due to cost  stop nebulizer pulmicort, atrovent and albuterol STart  -   Please start spiriva 1 puff daily - take sample, script and show technique  Please start symbicort 160/4.5 2 puff twice daily - take sample, script and show technique  Take albuterol inhaler 2 puff as needed If inhaler costly let me know Nurse will refer you to pulmonary rehab Return in 2 months to report progress AT followup do CAT score

## 2011-07-09 NOTE — Assessment & Plan Note (Signed)
Your numbers are better but not fully  Clear why you do not feel better . Perhaps you are deconditoned  PLAN Due to cost  stop nebulizer pulmicort, atrovent and albuterol  STart  -   Please start spiriva 1 puff daily - take sample, script and show technique  Please start symbicort 160/4.5 2 puff twice daily - take sample, script and show technique  Take albuterol inhaler 2 puff as needed If inhaler costly let me know   Nurse will refer you to pulmonary rehab  Return in 2 months to report progress  AT followup do CAT score

## 2011-07-09 NOTE — Progress Notes (Signed)
Subjective:    Patient ID: Scott Reese, male    DOB: 15-Jun-1927, 78 y.o.   MRN: [id number]  HPI Admitted 06/28/11 - 4/2/1 for 0. Former heavey smoker who lives with 13 cats   1. Severe COPD with acute exacerbation   - eff 55% , IgE 12.4, Spirometry - gold stage 4 copd 2. Acute on chronic diastolic CHF  3. Chronic atrial fibrillation, on Coumadin  4. HTN  5. Transient delirium felt secondary to EtOH withdrawal vs steroids  6. Acute respiratory failure, improved (hypoxia on ambulation during hospitalization, was up to 96% on RA at end of hospitalization)   Secondary Discharge Diagnoses:  1. CAD - 40-50% LCx by cath 2004  2. Dyslipidemia  3. GERD      OV 07/09/2011 Fu for above. Sjectivle: not better. Still dyspneic and baseline cough.  C/O neb cost . Spirometry is actually better: FEv1 is  1.16L/39% and Ratio 40s and improvd. Denies chest pain, fever, weight loss, hemoptysis.      Current outpatient prescriptions:albuterol (PROVENTIL) (2.5 MG/3ML) 0.083% nebulizer solution, Take 3 mLs (2.5 mg total) by nebulization every 6 (six) hours. May also use every 3 hours in between scheduled treatments as needed for shortness of breath or wheezing. Max = 5 treatments per day. If you are having to use more than that, contact your pulmonary doctor., Disp: 360 mL, Rfl: 6 allopurinol (ZYLOPRIM) 300 MG tablet, Take 300 mg by mouth daily., Disp: , Rfl: ;  aspirin EC 81 MG EC tablet, Take 1 tablet (81 mg total) by mouth daily., Disp: , Rfl: ;  atorvastatin (LIPITOR) 10 MG tablet, Take 10 mg by mouth daily., Disp: , Rfl: ;  budesonide (PULMICORT) 0.25 MG/2ML nebulizer solution, Take 2 mLs (0.25 mg total) by nebulization 2 (two) times daily., Disp: 120 mL, Rfl: 6 furosemide (LASIX) 40 MG tablet, Take 40 mg by mouth 2 (two) times daily. , Disp: , Rfl: ;  guaiFENesin (MUCINEX) 600 MG 12 hr tablet, Take 1 tablet (600 mg total) by mouth 2 (two) times daily as needed for congestion., Disp: 60 tablet, Rfl: 1;   HYDROcodone-acetaminophen (NORCO) 5-325 MG per tablet, Take 1 tablet by mouth daily as needed. For pain., Disp: , Rfl:  ipratropium (ATROVENT) 0.02 % nebulizer solution, Take 2.5 mLs (0.5 mg total) by nebulization every 6 (six) hours., Disp: 300 mL, Rfl: 6;  metoprolol (LOPRESSOR) 100 MG tablet, Take 50 mg by mouth 2 (two) times daily. , Disp: , Rfl: ;  Multiple Vitamin (MULITIVITAMIN WITH MINERALS) TABS, Take 1 tablet by mouth daily., Disp: , Rfl:  nitroGLYCERIN (NITROSTAT) 0.4 MG SL tablet, Place 0.4 mg under the tongue every 5 (five) minutes as needed. For chest pain., Disp: , Rfl: ;  omeprazole (PRILOSEC) 40 MG capsule, Take 40 mg by mouth daily., Disp: , Rfl:  warfarin (COUMADIN) 5 MG tablet, SPECIAL INSTRUCTIONS: At discharge, you were given one 2mg  tablet of Coumadin by the hospital. You will take 1/2 tablet of this tonight (07/03/11) and the other 1/2 tomorrow (07/04/11). On Sunday (07/05/11), you will go back to taking 1/2 tablet of your regular 5mg  pill. On Monday your Coumadin level will be checked to see if your dose needs adjusting., Disp: , Rfl:   Review of Systems  Constitutional: Negative for fever and unexpected weight change.  HENT: Negative for ear pain, nosebleeds, congestion, sore throat, rhinorrhea, sneezing, trouble swallowing, dental problem, postnasal drip and sinus pressure.   Eyes: Negative for redness and itching.  Respiratory: Positive for  cough and shortness of breath. Negative for chest tightness and wheezing.   Cardiovascular: Negative for palpitations and leg swelling.  Gastrointestinal: Negative for nausea and vomiting.  Genitourinary: Negative for dysuria.  Musculoskeletal: Negative for joint swelling.  Skin: Negative for rash.  Neurological: Negative for headaches.  Hematological: Does not bruise/bleed easily.  Psychiatric/Behavioral: Negative for dysphoric mood. The patient is not nervous/anxious.        Objective:   Physical Exam  Nursing note and vitals  reviewed. Constitutional: He is oriented to person, place, and time. He appears well-developed and well-nourished. No distress.  HENT:  Head: Normocephalic and atraumatic.  Right Ear: External ear normal.  Left Ear: External ear normal.  Mouth/Throat: Oropharynx is clear and moist. No oropharyngeal exudate.  Eyes: Conjunctivae and EOM are normal. Pupils are equal, round, and reactive to light. Right eye exhibits no discharge. Left eye exhibits no discharge. No scleral icterus.  Neck: Normal range of motion. Neck supple. No JVD present. No tracheal deviation present. No thyromegaly present.  Cardiovascular: Normal rate, regular rhythm and intact distal pulses.  Exam reveals no gallop and no friction rub.   No murmur heard. Pulmonary/Chest: Effort normal and breath sounds normal. No respiratory distress. He has no wheezes. He has no rales. He exhibits no tenderness.  Abdominal: Soft. Bowel sounds are normal. He exhibits no distension and no mass. There is no tenderness. There is no rebound and no guarding.  Musculoskeletal: Normal range of motion. He exhibits no edema and no tenderness.  Lymphadenopathy:    He has no cervical adenopathy.  Neurological: He is alert and oriented to person, place, and time. He has normal reflexes. No cranial nerve deficit. Coordination normal.  Skin: Skin is warm and dry. No rash noted. He is not diaphoretic. No erythema. No pallor.  Psychiatric: He has a normal mood and affect. His behavior is normal. Judgment and thought content normal.          Assessment & Plan:

## 2011-07-10 ENCOUNTER — Other Ambulatory Visit: Payer: Self-pay | Admitting: *Deleted

## 2011-07-10 MED ORDER — POTASSIUM CHLORIDE CRYS ER 20 MEQ PO TBCR: 20.0000 meq | EXTENDED_RELEASE_TABLET | Freq: Every day | ORAL | Status: DC

## 2011-07-10 MED ORDER — METOPROLOL TARTRATE 100 MG PO TABS: 50.0000 mg | ORAL_TABLET | Freq: Two times a day (BID) | ORAL | Status: DC

## 2011-07-10 MED ORDER — FUROSEMIDE 40 MG PO TABS: 40.0000 mg | ORAL_TABLET | Freq: Two times a day (BID) | ORAL | Status: DC

## 2011-07-10 MED ORDER — ALLOPURINOL 300 MG PO TABS: 300.0000 mg | ORAL_TABLET | Freq: Every day | ORAL | Status: DC

## 2011-07-10 MED ORDER — ATORVASTATIN CALCIUM 10 MG PO TABS: 10.0000 mg | ORAL_TABLET | Freq: Every day | ORAL | Status: DC

## 2011-07-10 NOTE — Telephone Encounter (Signed)
Refilled metoprolol potassium,allopurinol,atorvastatin,furosemide

## 2011-07-13 MED ORDER — WARFARIN SODIUM 5 MG PO TABS: ORAL_TABLET | ORAL | Status: DC

## 2011-07-15 ENCOUNTER — Ambulatory Visit (INDEPENDENT_AMBULATORY_CARE_PROVIDER_SITE_OTHER): Payer: Medicare Other | Admitting: Pharmacist

## 2011-07-15 ENCOUNTER — Encounter: Payer: Self-pay | Admitting: Nurse Practitioner

## 2011-07-15 ENCOUNTER — Ambulatory Visit (INDEPENDENT_AMBULATORY_CARE_PROVIDER_SITE_OTHER): Payer: Medicare Other | Admitting: Nurse Practitioner

## 2011-07-15 VITALS — BP 118/70 | HR 84 | Ht 68.0 in | Wt 191.4 lb

## 2011-07-15 DIAGNOSIS — I4891 Unspecified atrial fibrillation: Secondary | ICD-10-CM

## 2011-07-15 DIAGNOSIS — I2510 Atherosclerotic heart disease of native coronary artery without angina pectoris: Secondary | ICD-10-CM

## 2011-07-15 DIAGNOSIS — J449 Chronic obstructive pulmonary disease, unspecified: Secondary | ICD-10-CM

## 2011-07-15 DIAGNOSIS — I480 Paroxysmal atrial fibrillation: Secondary | ICD-10-CM

## 2011-07-15 DIAGNOSIS — F1010 Alcohol abuse, uncomplicated: Secondary | ICD-10-CM

## 2011-07-15 DIAGNOSIS — I5030 Unspecified diastolic (congestive) heart failure: Secondary | ICD-10-CM

## 2011-07-15 DIAGNOSIS — R0602 Shortness of breath: Secondary | ICD-10-CM

## 2011-07-15 DIAGNOSIS — R0600 Dyspnea, unspecified: Secondary | ICD-10-CM

## 2011-07-15 DIAGNOSIS — I482 Chronic atrial fibrillation, unspecified: Secondary | ICD-10-CM

## 2011-07-15 LAB — CBC WITH DIFFERENTIAL/PLATELET
Basophils Absolute: 0 10*3/uL (ref 0.0–0.1)
Basophils Relative: 0.2 % (ref 0.0–3.0)
Eosinophils Absolute: 0.1 10*3/uL (ref 0.0–0.7)
Eosinophils Relative: 0.7 % (ref 0.0–5.0)
HCT: 41.6 % (ref 39.0–52.0)
Hemoglobin: 13.5 g/dL (ref 13.0–17.0)
Lymphocytes Relative: 6.6 % — ABNORMAL LOW (ref 12.0–46.0)
Lymphs Abs: 0.8 10*3/uL (ref 0.7–4.0)
MCHC: 32.4 g/dL (ref 30.0–36.0)
MCV: 91.6 fl (ref 78.0–100.0)
Monocytes Absolute: 0.5 10*3/uL (ref 0.1–1.0)
Monocytes Relative: 4 % (ref 3.0–12.0)
Neutro Abs: 11.1 10*3/uL — ABNORMAL HIGH (ref 1.4–7.7)
Neutrophils Relative %: 88.5 % — ABNORMAL HIGH (ref 43.0–77.0)
Platelets: 149 10*3/uL — ABNORMAL LOW (ref 150.0–400.0)
RBC: 4.54 Mil/uL (ref 4.22–5.81)
RDW: 16.9 % — ABNORMAL HIGH (ref 11.5–14.6)
WBC: 12.6 10*3/uL — ABNORMAL HIGH (ref 4.5–10.5)

## 2011-07-15 LAB — BASIC METABOLIC PANEL WITH GFR
BUN: 35 mg/dL — ABNORMAL HIGH (ref 6–23)
CO2: 36 meq/L — ABNORMAL HIGH (ref 19–32)
Calcium: 8.9 mg/dL (ref 8.4–10.5)
Chloride: 99 meq/L (ref 96–112)
Creatinine, Ser: 1.4 mg/dL (ref 0.4–1.5)
GFR: 49.95 mL/min — ABNORMAL LOW
Glucose, Bld: 118 mg/dL — ABNORMAL HIGH (ref 70–99)
Potassium: 4.4 meq/L (ref 3.5–5.1)
Sodium: 141 meq/L (ref 135–145)

## 2011-07-15 LAB — BRAIN NATRIURETIC PEPTIDE: Pro B Natriuretic peptide (BNP): 159 pg/mL — ABNORMAL HIGH (ref 0.0–100.0)

## 2011-07-15 NOTE — Progress Notes (Signed)
Scott Reese Date of Birth: 1928/02/10 Medical Record #[id number]  History of Present Illness: Scott Reese is seen today for a post hospital visit. He is seen for Scott Reese. He is an 78 year old male with known CAD per remote cath in 2004, last stress test in 2011 with no ischemia and improved LV function from 46 to 61%. He has severe COPD, daily alcohol use, chronic atrial fib, chronic anticoagulation, and HTN.   He comes in today. He is here alone. He has been home for about 2 weeks. He had been admitted with a COPD exacerbation/acute respiratory failure and had increased heart rate in the setting of his chronic atrial fib. Treated with diuretics/antibiotics/nebs/steroids. Did have a dry cough after starting ACE and this was subsequently stopped. He is back to his dose of metoprolol and transitioned to oral Lasix. Did have some DT's that he does not remember. Says he still feels weak and "beat up". He is not really short of breath. Feels tired and weak. Not dizzy. No syncope. Has about 2 more days of his prednisone. Has finished his antibiotics. Continues to drink. He is using nebulizers that he "bought off the street" due to the prescription costing too much. No chest pain. Has chronic swelling. Not elevating his legs. Salt use is questionable.   Current Outpatient Prescriptions on File Prior to Visit  Medication Sig Dispense Refill  . albuterol (PROVENTIL) (2.5 MG/3ML) 0.083% nebulizer solution Take 3 mLs (2.5 mg total) by nebulization every 6 (six) hours. May also use every 3 hours in between scheduled treatments as needed for shortness of breath or wheezing. Max = 5 treatments per day. If you are having to use more than that, contact your pulmonary doctor.  360 mL  6  . allopurinol (ZYLOPRIM) 300 MG tablet Take 1 tablet (300 mg total) by mouth daily.  90 tablet  3  . aspirin EC 81 MG EC tablet Take 1 tablet (81 mg total) by mouth daily.      Marland Kitchen atorvastatin (LIPITOR) 10 MG tablet Take 1 tablet (10  mg total) by mouth daily.  90 tablet  3  . budesonide (PULMICORT) 0.25 MG/2ML nebulizer solution Take 2 mLs (0.25 mg total) by nebulization 2 (two) times daily.  120 mL  6  . furosemide (LASIX) 40 MG tablet Take 1 tablet (40 mg total) by mouth 2 (two) times daily.  180 tablet  3  . guaiFENesin (MUCINEX) 600 MG 12 hr tablet Take 1 tablet (600 mg total) by mouth 2 (two) times daily as needed for congestion.  60 tablet  1  . HYDROcodone-acetaminophen (NORCO) 5-325 MG per tablet Take 1 tablet by mouth daily as needed. For pain.      Marland Kitchen ipratropium (ATROVENT) 0.02 % nebulizer solution Take 2.5 mLs (0.5 mg total) by nebulization every 6 (six) hours.  300 mL  6  . metoprolol (LOPRESSOR) 100 MG tablet Take 0.5 tablets (50 mg total) by mouth 2 (two) times daily.  90 tablet  3  . Multiple Vitamin (MULITIVITAMIN WITH MINERALS) TABS Take 1 tablet by mouth daily.      . nitroGLYCERIN (NITROSTAT) 0.4 MG SL tablet Place 0.4 mg under the tongue every 5 (five) minutes as needed. For chest pain.      Marland Kitchen omeprazole (PRILOSEC) 40 MG capsule Take 40 mg by mouth daily.      . potassium chloride SA (K-DUR,KLOR-CON) 20 MEQ tablet Take 1 tablet (20 mEq total) by mouth daily.  90 tablet  3  . warfarin (COUMADIN) 5 MG tablet Take as directed by Anticoagulation Clinic  30 tablet  2    No Known Allergies  Past Medical History  Diagnosis Date  . Coronary artery disease     40-50% LCx by cath 2004  . COPD (chronic obstructive pulmonary disease)     Severe  . Atrial fibrillation   . Hyperlipidemia   . Diastolic CHF   . Hypertension   . GERD (gastroesophageal reflux disease)   . Alcohol abuse   . Diastolic heart failure April 2013    Normal EF, mild AR, mild MR, moderate LAE, mod-severely RAE and severe TR  . Chronic anticoagulation     on Coumadin    Past Surgical History  Procedure Date  . Cardiac catheterization 2004    EF 50%/L circumflex artery very lg vessel/proximal vessel is approx. 66mm/40-50% eccentric  stenosis proximal aspect of vessel  . Transesophageal echocardiogram 06/18/99    no evidence of any clot in the L atruim or L atrial appendage & no mitral regurgiation/small central jet of aortic insufficiency  . Cardioversion 06/18/99    electrical atrical/ converted after single 200 watt-sec shock with AP paddles/ tolerated procedure well  . Breast surgery     History  Smoking status  . Former Smoker -- 2.0 packs/day for 40 years  . Types: Cigarettes  . Quit date: 03/09/1989  Smokeless tobacco  . Never Used    History  Alcohol Use  . 1.2 oz/week  . 2 Shots of liquor per week    1/5 per week per pt    Family History  Problem Relation Age of Onset  . Diabetes Father   . Stroke Brother   . Cancer Daughter     Review of Systems: The review of systems is per the HPI.  All other systems were reviewed and are negative.  Physical Exam: BP 118/70  Pulse 84  Ht 5\' 8"  (1.727 m)  Wt 191 lb 6.4 oz (86.818 kg)  BMI 29.10 kg/m2 Patient is pleasant and in no acute distress. Has a congested cough. Skin is warm and dry. Color is normal.  HEENT is unremarkable. Normocephalic/atraumatic. PERRL. Sclera are nonicteric. Neck is supple. No masses. No JVD. Lungs are clear. Cardiac exam shows an irregular rhythm. His rate is controlled.  Abdomen is soft. Extremities are with 1-2+ edema bilaterally Gait and ROM are intact. No gross neurologic deficits noted.   LABORATORY DATA: PENDING FOR TODAY  Echo Study Conclusions April 2013:  - Left ventricle: The cavity size was normal. Wall thickness was normal. Systolic function was normal. The estimated ejection fraction was in the range of 55% to 60%. - Aortic valve: Mild regurgitation. - Mitral valve: Mild regurgitation. - Left atrium: The atrium was moderately dilated. - Right atrium: The atrium was moderately to severely dilated. - Atrial septum: No defect or patent foramen ovale was identified. - Tricuspid valve: Severe  regurgitation.   Lab Results  Component Value Date   WBC 10.5 07/03/2011   HGB 13.8 07/03/2011   HCT 41.5 07/03/2011   PLT 122* 07/03/2011   GLUCOSE 155* 07/03/2011   CHOL 154 03/18/2011   TRIG 218.0* 03/18/2011   HDL 39.50 03/18/2011   LDLDIRECT 69.7 03/18/2011   ALT 23 03/18/2011   AST 26 03/18/2011   NA 140 07/03/2011   K 4.0 07/03/2011   CL 98 07/03/2011   CREATININE 1.27 07/03/2011   BUN 43* 07/03/2011   CO2 31 07/03/2011   TSH 0.288*  06/29/2011   INR 3.6 07/15/2011    Assessment / Plan:

## 2011-07-15 NOTE — Assessment & Plan Note (Signed)
No chest pain reported 

## 2011-07-15 NOTE — Patient Instructions (Addendum)
We are going to check some labs today  Stay on your current medicines. Finish your prednisone as prescribed  We will see you in about 3 weeks.  Minimize your salt and your alcohol use  Call the [facility] office at [phone] if you have any questions, problems or concerns.

## 2011-07-15 NOTE — Assessment & Plan Note (Signed)
He has chronic atrial fib. His rate is controlled today. No change in his current therapy.

## 2011-07-15 NOTE — Assessment & Plan Note (Signed)
He is back drinking daily. Not interested in stopping.

## 2011-07-15 NOTE — Assessment & Plan Note (Signed)
Has had recent exacerbation. Felt to have some allergic component from pollen and cat dander (has 13 cats). Slowly improving. Finished with antibiotics and almost finished with his steroids. Lungs are clear today. Oxygen sat was 98% by me. Probably did have some component of CHF from valvular disease and diastolic dysfunction as well. We are rechecking his labs today. I will see him back in 3 weeks. Salt restriction is encouraged.

## 2011-07-16 ENCOUNTER — Telehealth: Payer: Self-pay | Admitting: Internal Medicine

## 2011-07-16 MED ORDER — BUDESONIDE-FORMOTEROL FUMARATE 160-4.5 MCG/ACT IN AERO: 2.0000 | INHALATION_SPRAY | Freq: Two times a day (BID) | RESPIRATORY_TRACT | Status: DC

## 2011-07-16 MED ORDER — TIOTROPIUM BROMIDE MONOHYDRATE 18 MCG IN CAPS: 18.0000 ug | ORAL_CAPSULE | Freq: Every day | RESPIRATORY_TRACT | Status: DC

## 2011-07-16 NOTE — Telephone Encounter (Signed)
I spoke with pt and advised him that [facility] does not send out inhalers. Pt states he needs rx to go through [facility] then. I have sent in rx and nothing further was needed

## 2011-07-16 NOTE — Telephone Encounter (Signed)
Pt returned call. Says this was to go thru [facility]. Says he has "been going around and around with this for 2 weeks and wants this called in asap today". Hazel Sams

## 2011-07-16 NOTE — Telephone Encounter (Signed)
Spoke with Angelica Chessman and she states that the pt called her stating that he needs rxs for symbicort and spiriva. Looks like meds were not sent to pharm. She does not know where he needs meds sent, so I called the pt to ask him and had to [facility].

## 2011-07-17 ENCOUNTER — Telehealth: Payer: Self-pay | Admitting: *Deleted

## 2011-07-17 NOTE — Telephone Encounter (Signed)
Message copied by Burnell Blanks on Fri Jul 17, 2011 12:30 PM ------      Message from: Rosalio Macadamia      Created: Wed Jul 15, 2011  2:02 PM       Ok to report. Labs are satisfactory. I suspect his white count is elevated due to being on prednisone.

## 2011-07-17 NOTE — Telephone Encounter (Signed)
Advised of labs 

## 2011-07-28 ENCOUNTER — Other Ambulatory Visit: Payer: Self-pay | Admitting: Cardiology

## 2011-07-28 MED ORDER — FUROSEMIDE 40 MG PO TABS: 40.0000 mg | ORAL_TABLET | Freq: Two times a day (BID) | ORAL | Status: DC

## 2011-07-28 MED ORDER — POTASSIUM CHLORIDE CRYS ER 20 MEQ PO TBCR: 20.0000 meq | EXTENDED_RELEASE_TABLET | Freq: Every day | ORAL | Status: DC

## 2011-07-28 MED ORDER — METOPROLOL TARTRATE 100 MG PO TABS: 50.0000 mg | ORAL_TABLET | Freq: Two times a day (BID) | ORAL | Status: DC

## 2011-07-28 MED ORDER — ALLOPURINOL 300 MG PO TABS: 300.0000 mg | ORAL_TABLET | Freq: Every day | ORAL | Status: DC

## 2011-07-28 MED ORDER — ATORVASTATIN CALCIUM 10 MG PO TABS: 10.0000 mg | ORAL_TABLET | Freq: Every day | ORAL | Status: DC

## 2011-07-29 ENCOUNTER — Ambulatory Visit (INDEPENDENT_AMBULATORY_CARE_PROVIDER_SITE_OTHER): Payer: Medicare Other | Admitting: Pharmacist

## 2011-07-29 DIAGNOSIS — I480 Paroxysmal atrial fibrillation: Secondary | ICD-10-CM

## 2011-07-29 DIAGNOSIS — I4891 Unspecified atrial fibrillation: Secondary | ICD-10-CM

## 2011-07-31 MED ORDER — WARFARIN SODIUM 5 MG PO TABS: ORAL_TABLET | ORAL | Status: DC

## 2011-08-05 ENCOUNTER — Ambulatory Visit (INDEPENDENT_AMBULATORY_CARE_PROVIDER_SITE_OTHER): Payer: Medicare Other | Admitting: Nurse Practitioner

## 2011-08-05 ENCOUNTER — Encounter: Payer: Self-pay | Admitting: Nurse Practitioner

## 2011-08-05 ENCOUNTER — Ambulatory Visit (INDEPENDENT_AMBULATORY_CARE_PROVIDER_SITE_OTHER): Payer: Medicare Other | Admitting: Pharmacist

## 2011-08-05 VITALS — BP 134/78 | HR 89 | Ht 68.0 in | Wt 197.0 lb

## 2011-08-05 DIAGNOSIS — F1010 Alcohol abuse, uncomplicated: Secondary | ICD-10-CM

## 2011-08-05 DIAGNOSIS — I480 Paroxysmal atrial fibrillation: Secondary | ICD-10-CM

## 2011-08-05 DIAGNOSIS — I4891 Unspecified atrial fibrillation: Secondary | ICD-10-CM

## 2011-08-05 DIAGNOSIS — I482 Chronic atrial fibrillation, unspecified: Secondary | ICD-10-CM

## 2011-08-05 DIAGNOSIS — J449 Chronic obstructive pulmonary disease, unspecified: Secondary | ICD-10-CM

## 2011-08-05 LAB — POCT INR: INR: 4.3

## 2011-08-05 NOTE — Assessment & Plan Note (Signed)
Breathing seems back to his baseline.

## 2011-08-05 NOTE — Patient Instructions (Signed)
Stay on your current medicines  Stay away from salt  You may use Tylenol arthritis for the pain in your hands  We will see you back in 4 months with Dr. Patty Reese  Call the [facility] office at [phone] if you have any questions, problems or concerns.

## 2011-08-05 NOTE — Assessment & Plan Note (Signed)
He is not interested in stopping. He is quite content with his current status.

## 2011-08-05 NOTE — Assessment & Plan Note (Signed)
His rate is controlled. His coumadin is adjusted by the clinic. No other changes made today.

## 2011-08-05 NOTE — Progress Notes (Signed)
Scott Reese Date of Birth: 07/14/1927 Medical Record #[id number]  History of Present Illness: Scott Reese is seen today for a 3 week check. He is seen for Dr. Patty Reese. He has known CAD per remote cath in 2004, last stress study in 2011 with no ischemia nd improved LV function from 46 to 61%. Has severe COPD, daily alcohol use, chronic atrial fib on coumadin, renal insufficiency and HTN. He has had a recent COPD exacerbation/acute respiratory failure with increase in his heart rate with his atrial fib. He was treated with diuretics/antibiotics/nebs/steroids. His EF is normal at 55 to 60%.   He comes in today. Says he is doing ok and happy with how he feels. He has daily alcohol use and has no intention on stopping. Coumadin was a little high today and was adjusted by the clinic. Says his swelling is better. Weight is up but he says its because his pockets are too heavy. Breathing is ok. He feels ok on his medicines.   Current Outpatient Prescriptions on File Prior to Visit  Medication Sig Dispense Refill  . albuterol (PROVENTIL) (2.5 MG/3ML) 0.083% nebulizer solution Take 3 mLs (2.5 mg total) by nebulization every 6 (six) hours. May also use every 3 hours in between scheduled treatments as needed for shortness of breath or wheezing. Max = 5 treatments per day. If you are having to use more than that, contact your pulmonary doctor.  360 mL  6  . allopurinol (ZYLOPRIM) 300 MG tablet Take 1 tablet (300 mg total) by mouth daily.  90 tablet  3  . aspirin EC 81 MG EC tablet Take 1 tablet (81 mg total) by mouth daily.      . budesonide (PULMICORT) 0.25 MG/2ML nebulizer solution Take 2 mLs (0.25 mg total) by nebulization 2 (two) times daily.  120 mL  6  . budesonide-formoterol (SYMBICORT) 160-4.5 MCG/ACT inhaler Inhale 2 puffs into the lungs 2 (two) times daily.  1 Inhaler  12  . furosemide (LASIX) 40 MG tablet Take 1 tablet (40 mg total) by mouth 2 (two) times daily.  180 tablet  3  . guaiFENesin (MUCINEX)  600 MG 12 hr tablet Take 1 tablet (600 mg total) by mouth 2 (two) times daily as needed for congestion.  60 tablet  1  . ipratropium (ATROVENT) 0.02 % nebulizer solution Take 2.5 mLs (0.5 mg total) by nebulization every 6 (six) hours.  300 mL  6  . metoprolol (LOPRESSOR) 100 MG tablet Take 0.5 tablets (50 mg total) by mouth 2 (two) times daily.  90 tablet  3  . Multiple Vitamin (MULITIVITAMIN WITH MINERALS) TABS Take 1 tablet by mouth daily.      . nitroGLYCERIN (NITROSTAT) 0.4 MG SL tablet Place 0.4 mg under the tongue every 5 (five) minutes as needed. For chest pain.      Marland Kitchen omeprazole (PRILOSEC) 40 MG capsule Take 40 mg by mouth daily.      . potassium chloride SA (K-DUR,KLOR-CON) 20 MEQ tablet Take 1 tablet (20 mEq total) by mouth daily.  90 tablet  3  . tiotropium (SPIRIVA) 18 MCG inhalation capsule Place 1 capsule (18 mcg total) into inhaler and inhale daily.  30 capsule  6  . warfarin (COUMADIN) 5 MG tablet Take as directed by Anticoagulation Clinic  90 tablet  1  . atorvastatin (LIPITOR) 10 MG tablet Take 1 tablet (10 mg total) by mouth daily.  90 tablet  3  . HYDROcodone-acetaminophen (NORCO) 5-325 MG per tablet Take 1 tablet  by mouth daily as needed. For pain.        No Known Allergies  Past Medical History  Diagnosis Date  . Coronary artery disease     40-50% LCx by cath 2004  . COPD (chronic obstructive pulmonary disease)     Severe  . Atrial fibrillation   . Hyperlipidemia   . Diastolic CHF   . Hypertension   . GERD (gastroesophageal reflux disease)   . Alcohol abuse   . Diastolic heart failure April 2013    Normal EF, mild AR, mild MR, moderate LAE, mod-severely RAE and severe TR  . Chronic anticoagulation     on Coumadin    Past Surgical History  Procedure Date  . Cardiac catheterization 2004    EF 50%/L circumflex artery very lg vessel/proximal vessel is approx. 53mm/40-50% eccentric stenosis proximal aspect of vessel  . Transesophageal echocardiogram 06/18/99     no evidence of any clot in the L atruim or L atrial appendage & no mitral regurgiation/small central jet of aortic insufficiency  . Cardioversion 06/18/99    electrical atrical/ converted after single 200 watt-sec shock with AP paddles/ tolerated procedure well  . Breast surgery     History  Smoking status  . Former Smoker -- 2.0 packs/day for 40 years  . Types: Cigarettes  . Quit date: 03/09/1989  Smokeless tobacco  . Never Used    History  Alcohol Use  . 1.2 oz/week  . 2 Shots of liquor per week    1/5 per week per pt    Family History  Problem Relation Age of Onset  . Diabetes Father   . Stroke Brother   . Cancer Daughter     Review of Systems: The review of systems is per the HPI.  All other systems were reviewed and are negative.  Physical Exam: BP 134/78  Pulse 89  Ht 5\' 8"  (1.727 m)  Wt 197 lb (89.359 kg)  BMI 29.95 kg/m2  SpO2 96% Patient is very pleasant and in no acute distress. Skin is warm and dry. Color is normal.  HEENT is unremarkable. Normocephalic/atraumatic. PERRL. Sclera are nonicteric. Neck is supple. No masses. No JVD. Lungs are clear. Cardiac exam shows an irregular rhythm. Rate is controlled. Abdomen is obese but soft. Extremities are without edema. Gait and ROM are intact. No gross neurologic deficits noted.   LABORATORY DATA:  Lab Results  Component Value Date   WBC 12.6* 07/15/2011   HGB 13.5 07/15/2011   HCT 41.6 07/15/2011   PLT 149.0* 07/15/2011   GLUCOSE 118* 07/15/2011   CHOL 154 03/18/2011   TRIG 218.0* 03/18/2011   HDL 39.50 03/18/2011   LDLDIRECT 69.7 03/18/2011   ALT 23 03/18/2011   AST 26 03/18/2011   NA 141 07/15/2011   K 4.4 07/15/2011   CL 99 07/15/2011   CREATININE 1.4 07/15/2011   BUN 35* 07/15/2011   CO2 36* 07/15/2011   TSH 0.288* 06/29/2011   INR 4.3 08/05/2011    Lab Results  Component Value Date   INR 4.3 08/05/2011   INR 1.5 07/29/2011   INR 3.6 07/15/2011     Assessment / Plan:

## 2011-08-19 ENCOUNTER — Ambulatory Visit (INDEPENDENT_AMBULATORY_CARE_PROVIDER_SITE_OTHER): Payer: Medicare Other | Admitting: Pharmacist

## 2011-08-19 DIAGNOSIS — I480 Paroxysmal atrial fibrillation: Secondary | ICD-10-CM

## 2011-08-19 DIAGNOSIS — I4891 Unspecified atrial fibrillation: Secondary | ICD-10-CM

## 2011-08-19 LAB — POCT INR: INR: 3.7

## 2011-09-03 ENCOUNTER — Ambulatory Visit (INDEPENDENT_AMBULATORY_CARE_PROVIDER_SITE_OTHER): Payer: Medicare Other | Admitting: *Deleted

## 2011-09-03 DIAGNOSIS — I4891 Unspecified atrial fibrillation: Secondary | ICD-10-CM

## 2011-09-03 DIAGNOSIS — I480 Paroxysmal atrial fibrillation: Secondary | ICD-10-CM

## 2011-09-03 LAB — POCT INR: INR: 3.5

## 2011-09-11 ENCOUNTER — Ambulatory Visit: Payer: Medicare Other | Admitting: Internal Medicine

## 2011-09-15 ENCOUNTER — Ambulatory Visit (HOSPITAL_COMMUNITY): Payer: Medicare Other

## 2011-09-15 ENCOUNTER — Encounter (HOSPITAL_COMMUNITY): Payer: Medicare Other

## 2011-09-17 ENCOUNTER — Encounter (HOSPITAL_COMMUNITY): Payer: Medicare Other

## 2011-09-22 ENCOUNTER — Encounter (HOSPITAL_COMMUNITY): Payer: Medicare Other

## 2011-09-24 ENCOUNTER — Encounter (HOSPITAL_COMMUNITY): Payer: Medicare Other

## 2011-09-24 ENCOUNTER — Encounter (HOSPITAL_COMMUNITY): Payer: Self-pay

## 2011-09-24 ENCOUNTER — Encounter (HOSPITAL_COMMUNITY)
Admission: RE | Admit: 2011-09-24 | Discharge: 2011-09-24 | Disposition: A | Discharge status: Home / Self Care | Payer: Medicare Other | Source: Ambulatory Visit | Attending: Internal Medicine | Admitting: Internal Medicine

## 2011-09-24 DIAGNOSIS — I4510 Unspecified right bundle-branch block: Secondary | ICD-10-CM | POA: Insufficient documentation

## 2011-09-24 DIAGNOSIS — I10 Essential (primary) hypertension: Secondary | ICD-10-CM | POA: Insufficient documentation

## 2011-09-24 DIAGNOSIS — Z79899 Other long term (current) drug therapy: Secondary | ICD-10-CM | POA: Insufficient documentation

## 2011-09-24 DIAGNOSIS — Z7901 Long term (current) use of anticoagulants: Secondary | ICD-10-CM | POA: Insufficient documentation

## 2011-09-24 DIAGNOSIS — I509 Heart failure, unspecified: Secondary | ICD-10-CM | POA: Insufficient documentation

## 2011-09-24 DIAGNOSIS — J4489 Other specified chronic obstructive pulmonary disease: Secondary | ICD-10-CM | POA: Insufficient documentation

## 2011-09-24 DIAGNOSIS — J449 Chronic obstructive pulmonary disease, unspecified: Secondary | ICD-10-CM | POA: Insufficient documentation

## 2011-09-24 DIAGNOSIS — E785 Hyperlipidemia, unspecified: Secondary | ICD-10-CM | POA: Insufficient documentation

## 2011-09-24 DIAGNOSIS — K219 Gastro-esophageal reflux disease without esophagitis: Secondary | ICD-10-CM | POA: Insufficient documentation

## 2011-09-24 DIAGNOSIS — I2510 Atherosclerotic heart disease of native coronary artery without angina pectoris: Secondary | ICD-10-CM | POA: Insufficient documentation

## 2011-09-24 DIAGNOSIS — I080 Rheumatic disorders of both mitral and aortic valves: Secondary | ICD-10-CM | POA: Insufficient documentation

## 2011-09-24 DIAGNOSIS — I4891 Unspecified atrial fibrillation: Secondary | ICD-10-CM | POA: Insufficient documentation

## 2011-09-24 DIAGNOSIS — Z87891 Personal history of nicotine dependence: Secondary | ICD-10-CM | POA: Insufficient documentation

## 2011-09-24 DIAGNOSIS — Z5189 Encounter for other specified aftercare: Secondary | ICD-10-CM | POA: Insufficient documentation

## 2011-09-24 DIAGNOSIS — IMO0002 Reserved for concepts with insufficient information to code with codable children: Secondary | ICD-10-CM | POA: Insufficient documentation

## 2011-09-24 DIAGNOSIS — I079 Rheumatic tricuspid valve disease, unspecified: Secondary | ICD-10-CM | POA: Insufficient documentation

## 2011-09-24 DIAGNOSIS — I5032 Chronic diastolic (congestive) heart failure: Secondary | ICD-10-CM | POA: Insufficient documentation

## 2011-09-24 DIAGNOSIS — Z7982 Long term (current) use of aspirin: Secondary | ICD-10-CM | POA: Insufficient documentation

## 2011-09-24 DIAGNOSIS — Z9861 Coronary angioplasty status: Secondary | ICD-10-CM | POA: Insufficient documentation

## 2011-09-24 NOTE — Progress Notes (Signed)
Scott Reese came in today for orientation to [facility] rehab.  Health history and medications reviewed with patient,( lengthy review with meds) Demonstration and practice of PLB using pulse oximeter.  Patient able to return demonstration satisfactorily. Safety and hand hygiene in the exercise area reviewed with patient.  Patient voices understanding.  Goals set for [facility].  Walk test done.  Heart rate at rest 84, 132 at 6 min.  Oxygen levels 92-95 % RA.  He plans to start exercise on 09/29/11.  Cathie Olden

## 2011-09-28 ENCOUNTER — Ambulatory Visit (INDEPENDENT_AMBULATORY_CARE_PROVIDER_SITE_OTHER): Payer: Medicare Other | Admitting: Cardiology

## 2011-09-28 ENCOUNTER — Ambulatory Visit (INDEPENDENT_AMBULATORY_CARE_PROVIDER_SITE_OTHER): Payer: Medicare Other | Admitting: *Deleted

## 2011-09-28 ENCOUNTER — Other Ambulatory Visit (INDEPENDENT_AMBULATORY_CARE_PROVIDER_SITE_OTHER): Payer: Medicare Other

## 2011-09-28 ENCOUNTER — Encounter: Payer: Self-pay | Admitting: Cardiology

## 2011-09-28 VITALS — BP 120/70 | HR 70 | Ht 68.0 in | Wt 205.0 lb

## 2011-09-28 DIAGNOSIS — I482 Chronic atrial fibrillation, unspecified: Secondary | ICD-10-CM

## 2011-09-28 DIAGNOSIS — I119 Hypertensive heart disease without heart failure: Secondary | ICD-10-CM

## 2011-09-28 DIAGNOSIS — I2510 Atherosclerotic heart disease of native coronary artery without angina pectoris: Secondary | ICD-10-CM

## 2011-09-28 DIAGNOSIS — F1010 Alcohol abuse, uncomplicated: Secondary | ICD-10-CM

## 2011-09-28 DIAGNOSIS — I480 Paroxysmal atrial fibrillation: Secondary | ICD-10-CM

## 2011-09-28 DIAGNOSIS — I4891 Unspecified atrial fibrillation: Secondary | ICD-10-CM

## 2011-09-28 DIAGNOSIS — J441 Chronic obstructive pulmonary disease with (acute) exacerbation: Secondary | ICD-10-CM

## 2011-09-28 DIAGNOSIS — K219 Gastro-esophageal reflux disease without esophagitis: Secondary | ICD-10-CM

## 2011-09-28 DIAGNOSIS — E785 Hyperlipidemia, unspecified: Secondary | ICD-10-CM

## 2011-09-28 DIAGNOSIS — J449 Chronic obstructive pulmonary disease, unspecified: Secondary | ICD-10-CM

## 2011-09-28 LAB — HEPATIC FUNCTION PANEL
ALT: 21 U/L (ref 0–53)
AST: 27 U/L (ref 0–37)
Alkaline Phosphatase: 50 U/L (ref 39–117)
Bilirubin, Direct: 0.1 mg/dL (ref 0.0–0.3)
Total Bilirubin: 1 mg/dL (ref 0.3–1.2)
Total Protein: 6.7 g/dL (ref 6.0–8.3)

## 2011-09-28 LAB — BASIC METABOLIC PANEL
BUN: 29 mg/dL — ABNORMAL HIGH (ref 6–23)
Creatinine, Ser: 1.4 mg/dL (ref 0.4–1.5)
GFR: 50.74 mL/min — ABNORMAL LOW (ref >60.00)
Glucose, Bld: 116 mg/dL — ABNORMAL HIGH (ref 70–99)

## 2011-09-28 LAB — POCT INR: INR: 2

## 2011-09-28 LAB — LIPID PANEL
Cholesterol: 149 mg/dL (ref 0–200)
LDL Cholesterol: 79 mg/dL (ref 0–99)
VLDL: 19.6 mg/dL (ref 0.0–40.0)

## 2011-09-28 NOTE — Assessment & Plan Note (Signed)
The patient has not had any TIA symptoms.  He has not had any problems from his Coumadin

## 2011-09-28 NOTE — Assessment & Plan Note (Signed)
The patient has a history of dyslipidemia and is on low-dose Lipitor and is tolerating it without side effects

## 2011-09-28 NOTE — Assessment & Plan Note (Signed)
Presently he has pulmonary medications are Symbicort 160/4.5 one puff twice a day and Spiriva one inhalation daily.  He is also on Mucinex.  He is not having any purulent sputum fever or chills.  He has not had any severe or wheezing recently.

## 2011-09-28 NOTE — Assessment & Plan Note (Signed)
The patient denies any chest pain or angina pectoris. 

## 2011-09-28 NOTE — Patient Instructions (Signed)
Your physician recommends that you continue on your current medications as directed. Please refer to the Current Medication list given to you today.  Your physician recommends that you schedule a follow-up appointment in: 3 months with fasting labs (LP/BMET/HFP),and EKG

## 2011-09-28 NOTE — Progress Notes (Signed)
Quick Note:  Please report to patient. The recent labs are stable. Continue same medication and careful diet. Labs are better. ______

## 2011-09-28 NOTE — Progress Notes (Signed)
Scott Reese Date of Birth:  01/05/1928 [facility] [address] [place], [place]  [address] [phone]         Fax   [phone]  History of Present Illness: This pleasant 78 year old gentleman is seen for a scheduled followup office visit.  He has a complex past medical history.  He has known coronary artery disease and had a remote cardiac catheterization in 2004.  He had a stress test in 2011 showing no ischemia and his ejection fraction had improved from 46% to 61%.  The patient has chronic atrial fibrillation on Coumadin.  And he has a history of hypertension and mild renal insufficiency.  He has significant COPD and is followed by Dr. Marchelle Gearing.  Presently his breathing is doing well.  He does not require oxygen.  Current Outpatient Prescriptions  Medication Sig Dispense Refill  . allopurinol (ZYLOPRIM) 300 MG tablet Take 1 tablet (300 mg total) by mouth daily.  90 tablet  3  . atorvastatin (LIPITOR) 10 MG tablet Take 1 tablet (10 mg total) by mouth daily.  90 tablet  3  . budesonide-formoterol (SYMBICORT) 160-4.5 MCG/ACT inhaler Inhale 2 puffs into the lungs 2 (two) times daily.      . furosemide (LASIX) 40 MG tablet Take 1 tablet (40 mg total) by mouth 2 (two) times daily.  180 tablet  3  . guaiFENesin (MUCINEX) 600 MG 12 hr tablet Take 1 tablet (600 mg total) by mouth 2 (two) times daily as needed for congestion.  60 tablet  1  . HYDROcodone-acetaminophen (NORCO) 5-325 MG per tablet Take 1 tablet by mouth daily as needed. For pain.      . metoprolol (LOPRESSOR) 100 MG tablet Take 0.5 tablets (50 mg total) by mouth 2 (two) times daily.  90 tablet  3  . Multiple Vitamin (MULITIVITAMIN WITH MINERALS) TABS Take 1 tablet by mouth daily.      . nitroGLYCERIN (NITROSTAT) 0.4 MG SL tablet Place 0.4 mg under the tongue every 5 (five) minutes as needed. For chest pain.      . potassium chloride SA (K-DUR,KLOR-CON) 20 MEQ tablet Take 1 tablet (20 mEq total) by mouth  daily.  90 tablet  3  . tiotropium (SPIRIVA) 18 MCG inhalation capsule Place 1 capsule (18 mcg total) into inhaler and inhale daily.  30 capsule  6  . warfarin (COUMADIN) 5 MG tablet Take as directed by Anticoagulation Clinic  90 tablet  1    No Known Allergies  Patient Active Problem List  Diagnosis  . Coronary artery disease  . Dyspnea  . COPD exacerbation  . Paroxysmal atrial fibrillation  . CHF (congestive heart failure)  . Hyperlipidemia  . Dyspepsia  . Chronic atrial fibrillation  . Dyslipidemia  . Dizziness  . Cough  . COPD (chronic obstructive pulmonary disease)  . Alcohol abuse    History  Smoking status  . Former Smoker -- 2.0 packs/day for 40 years  . Types: Cigarettes  . Quit date: 03/09/1989  Smokeless tobacco  . Never Used    History  Alcohol Use  . 1.2 oz/week  . 2 Shots of liquor per week    1/5 per week per pt    Family History  Problem Relation Age of Onset  . Diabetes Father   . Stroke Brother   . Cancer Daughter     Review of Systems: Constitutional: no fever chills diaphoresis or fatigue or change in weight.  Head and neck: no hearing loss, no  epistaxis, no photophobia or visual disturbance. Respiratory: No cough, shortness of breath or wheezing. Cardiovascular: No chest pain peripheral edema, palpitations. Gastrointestinal: No abdominal distention, no abdominal pain, no change in bowel habits hematochezia or melena. Genitourinary: No dysuria, no frequency, no urgency, no nocturia. Musculoskeletal:No arthralgias, no back pain, no gait disturbance or myalgias. Neurological: No dizziness, no headaches, no numbness, no seizures, no syncope, no weakness, no tremors. Hematologic: No lymphadenopathy, no easy bruising. Psychiatric: No confusion, no hallucinations, no sleep disturbance.    Physical Exam: Filed Vitals:   09/28/11 1019  BP: 120/70  Pulse: 70   the general appearance reveals a well-developed elderly gentleman in no  distress.Pupils equal and reactive.   Extraocular Movements are full.  There is no scleral icterus.  The mouth and pharynx are normal.  The neck is supple.  The carotids reveal no bruits.  The jugular venous pressure is normal.  The thyroid is not enlarged.  There is no lymphadenopathy.  Chest reveals no wheezing and he has good breath sounds bilaterally. The heart reveals a soft musical apical systolic murmur grade 2/6. Abdomen reveals no hepatosplenomegaly or masses.The pedal pulses are good.  There is no phlebitis and there is mild pretibial edema  There is no cyanosis or clubbing. The skin is warm and dry.  There is no rash. Strength is normal and symmetrical in all extremities.  There is no lateralizing weakness.  There are no sensory deficits.     Assessment / Plan: Continue present medication.  Recheck in 3 months for followup office visit lipid panel hepatic function panel basal metabolic panel and EKG

## 2011-09-28 NOTE — Assessment & Plan Note (Signed)
The patient is still having about 3 mixed drinks each night.  We talked about how when he was in the hospital recently he did have some evidence of alcohol withdrawal.  I have urged him to cut back on his chronic alcohol intake

## 2011-09-29 ENCOUNTER — Encounter (HOSPITAL_COMMUNITY)
Admission: RE | Admit: 2011-09-29 | Discharge: 2011-09-29 | Disposition: A | Discharge status: Home / Self Care | Payer: Medicare Other | Source: Ambulatory Visit | Attending: Internal Medicine | Admitting: Internal Medicine

## 2011-09-29 NOTE — Progress Notes (Signed)
Mr. Burley came to [facility] today  For first exercise session.  He was oriented to equipment use, RPE and dyspnea scale, rest breaks, safety and hand hygiene.  Demonstration and practice of PLB on each exercise station.  He did experience some discomfort in his left shoulder when attempting stretches.  We advised to eliminate any stretching that involved his left arm.  Tired when walking.  Vital signs were stable with exercise.  Continue to encourage and support.  Cathie Olden, RN

## 2011-10-01 ENCOUNTER — Encounter (HOSPITAL_COMMUNITY)
Admission: RE | Admit: 2011-10-01 | Discharge: 2011-10-01 | Disposition: A | Discharge status: Home / Self Care | Payer: Medicare Other | Source: Ambulatory Visit | Attending: Internal Medicine | Admitting: Internal Medicine

## 2011-10-06 ENCOUNTER — Encounter (HOSPITAL_COMMUNITY)
Admission: RE | Admit: 2011-10-06 | Discharge: 2011-10-06 | Disposition: A | Discharge status: Home / Self Care | Payer: Medicare Other | Source: Ambulatory Visit | Attending: Cardiology | Admitting: Cardiology

## 2011-10-08 ENCOUNTER — Encounter (HOSPITAL_COMMUNITY): Payer: Medicare Other

## 2011-10-09 ENCOUNTER — Other Ambulatory Visit: Payer: Self-pay | Admitting: *Deleted

## 2011-10-09 MED ORDER — NITROGLYCERIN 0.4 MG SL SUBL: 0.4000 mg | SUBLINGUAL_TABLET | SUBLINGUAL | Status: DC | PRN

## 2011-10-13 ENCOUNTER — Encounter (HOSPITAL_COMMUNITY)
Admission: RE | Admit: 2011-10-13 | Discharge: 2011-10-13 | Disposition: A | Discharge status: Home / Self Care | Payer: Medicare Other | Source: Ambulatory Visit | Attending: Internal Medicine | Admitting: Internal Medicine

## 2011-10-13 DIAGNOSIS — I10 Essential (primary) hypertension: Secondary | ICD-10-CM | POA: Insufficient documentation

## 2011-10-13 DIAGNOSIS — I509 Heart failure, unspecified: Secondary | ICD-10-CM | POA: Insufficient documentation

## 2011-10-13 DIAGNOSIS — I079 Rheumatic tricuspid valve disease, unspecified: Secondary | ICD-10-CM | POA: Insufficient documentation

## 2011-10-13 DIAGNOSIS — J449 Chronic obstructive pulmonary disease, unspecified: Secondary | ICD-10-CM | POA: Insufficient documentation

## 2011-10-13 DIAGNOSIS — Z79899 Other long term (current) drug therapy: Secondary | ICD-10-CM | POA: Insufficient documentation

## 2011-10-13 DIAGNOSIS — I4891 Unspecified atrial fibrillation: Secondary | ICD-10-CM | POA: Insufficient documentation

## 2011-10-13 DIAGNOSIS — I5032 Chronic diastolic (congestive) heart failure: Secondary | ICD-10-CM | POA: Insufficient documentation

## 2011-10-13 DIAGNOSIS — E785 Hyperlipidemia, unspecified: Secondary | ICD-10-CM | POA: Insufficient documentation

## 2011-10-13 DIAGNOSIS — J4489 Other specified chronic obstructive pulmonary disease: Secondary | ICD-10-CM | POA: Insufficient documentation

## 2011-10-13 DIAGNOSIS — K219 Gastro-esophageal reflux disease without esophagitis: Secondary | ICD-10-CM | POA: Insufficient documentation

## 2011-10-13 DIAGNOSIS — Z5189 Encounter for other specified aftercare: Secondary | ICD-10-CM | POA: Insufficient documentation

## 2011-10-13 DIAGNOSIS — I2510 Atherosclerotic heart disease of native coronary artery without angina pectoris: Secondary | ICD-10-CM | POA: Insufficient documentation

## 2011-10-13 DIAGNOSIS — Z9861 Coronary angioplasty status: Secondary | ICD-10-CM | POA: Insufficient documentation

## 2011-10-13 DIAGNOSIS — Z87891 Personal history of nicotine dependence: Secondary | ICD-10-CM | POA: Insufficient documentation

## 2011-10-13 DIAGNOSIS — I4510 Unspecified right bundle-branch block: Secondary | ICD-10-CM | POA: Insufficient documentation

## 2011-10-13 DIAGNOSIS — Z7901 Long term (current) use of anticoagulants: Secondary | ICD-10-CM | POA: Insufficient documentation

## 2011-10-13 DIAGNOSIS — IMO0002 Reserved for concepts with insufficient information to code with codable children: Secondary | ICD-10-CM | POA: Insufficient documentation

## 2011-10-13 DIAGNOSIS — I080 Rheumatic disorders of both mitral and aortic valves: Secondary | ICD-10-CM | POA: Insufficient documentation

## 2011-10-13 DIAGNOSIS — Z7982 Long term (current) use of aspirin: Secondary | ICD-10-CM | POA: Insufficient documentation

## 2011-10-15 ENCOUNTER — Encounter (HOSPITAL_COMMUNITY)
Admission: RE | Admit: 2011-10-15 | Discharge: 2011-10-15 | Disposition: A | Discharge status: Home / Self Care | Payer: Medicare Other | Source: Ambulatory Visit | Attending: Internal Medicine | Admitting: Internal Medicine

## 2011-10-19 ENCOUNTER — Ambulatory Visit (INDEPENDENT_AMBULATORY_CARE_PROVIDER_SITE_OTHER): Payer: Medicare Other | Admitting: Pharmacist

## 2011-10-19 DIAGNOSIS — I480 Paroxysmal atrial fibrillation: Secondary | ICD-10-CM

## 2011-10-19 DIAGNOSIS — I4891 Unspecified atrial fibrillation: Secondary | ICD-10-CM

## 2011-10-19 LAB — POCT INR: INR: 2.4

## 2011-10-20 ENCOUNTER — Encounter (HOSPITAL_COMMUNITY)
Admission: RE | Admit: 2011-10-20 | Discharge: 2011-10-20 | Disposition: A | Discharge status: Home / Self Care | Payer: Medicare Other | Source: Ambulatory Visit | Attending: Internal Medicine | Admitting: Internal Medicine

## 2011-10-22 ENCOUNTER — Other Ambulatory Visit: Payer: Medicare Other

## 2011-10-22 ENCOUNTER — Encounter (HOSPITAL_COMMUNITY)
Admission: RE | Admit: 2011-10-22 | Discharge: 2011-10-22 | Disposition: A | Discharge status: Home / Self Care | Payer: Medicare Other | Source: Ambulatory Visit | Attending: Internal Medicine | Admitting: Internal Medicine

## 2011-10-22 ENCOUNTER — Ambulatory Visit: Payer: Medicare Other | Admitting: Cardiology

## 2011-10-27 ENCOUNTER — Encounter (HOSPITAL_COMMUNITY)
Admission: RE | Admit: 2011-10-27 | Discharge: 2011-10-27 | Disposition: A | Discharge status: Home / Self Care | Payer: Medicare Other | Source: Ambulatory Visit | Attending: Internal Medicine | Admitting: Internal Medicine

## 2011-10-29 ENCOUNTER — Encounter (HOSPITAL_COMMUNITY)
Admission: RE | Admit: 2011-10-29 | Discharge: 2011-10-29 | Disposition: A | Discharge status: Home / Self Care | Payer: Medicare Other | Source: Ambulatory Visit | Attending: Internal Medicine | Admitting: Internal Medicine

## 2011-11-03 ENCOUNTER — Encounter (HOSPITAL_COMMUNITY)
Admission: RE | Admit: 2011-11-03 | Discharge: 2011-11-03 | Disposition: A | Discharge status: Home / Self Care | Payer: Medicare Other | Source: Ambulatory Visit | Attending: Internal Medicine | Admitting: Internal Medicine

## 2011-11-05 ENCOUNTER — Encounter (HOSPITAL_COMMUNITY)
Admission: RE | Admit: 2011-11-05 | Discharge: 2011-11-05 | Disposition: A | Discharge status: Home / Self Care | Payer: Medicare Other | Source: Ambulatory Visit | Attending: Internal Medicine | Admitting: Internal Medicine

## 2011-11-10 ENCOUNTER — Encounter (HOSPITAL_COMMUNITY): Payer: Medicare Other

## 2011-11-12 ENCOUNTER — Encounter (HOSPITAL_COMMUNITY)
Admission: RE | Admit: 2011-11-12 | Discharge: 2011-11-12 | Disposition: A | Discharge status: Home / Self Care | Payer: Medicare Other | Source: Ambulatory Visit | Attending: Internal Medicine | Admitting: Internal Medicine

## 2011-11-12 DIAGNOSIS — I079 Rheumatic tricuspid valve disease, unspecified: Secondary | ICD-10-CM | POA: Insufficient documentation

## 2011-11-12 DIAGNOSIS — Z79899 Other long term (current) drug therapy: Secondary | ICD-10-CM | POA: Insufficient documentation

## 2011-11-12 DIAGNOSIS — I4891 Unspecified atrial fibrillation: Secondary | ICD-10-CM | POA: Insufficient documentation

## 2011-11-12 DIAGNOSIS — J4489 Other specified chronic obstructive pulmonary disease: Secondary | ICD-10-CM | POA: Insufficient documentation

## 2011-11-12 DIAGNOSIS — Z9861 Coronary angioplasty status: Secondary | ICD-10-CM | POA: Insufficient documentation

## 2011-11-12 DIAGNOSIS — I4510 Unspecified right bundle-branch block: Secondary | ICD-10-CM | POA: Insufficient documentation

## 2011-11-12 DIAGNOSIS — I080 Rheumatic disorders of both mitral and aortic valves: Secondary | ICD-10-CM | POA: Insufficient documentation

## 2011-11-12 DIAGNOSIS — I5032 Chronic diastolic (congestive) heart failure: Secondary | ICD-10-CM | POA: Insufficient documentation

## 2011-11-12 DIAGNOSIS — Z7982 Long term (current) use of aspirin: Secondary | ICD-10-CM | POA: Insufficient documentation

## 2011-11-12 DIAGNOSIS — Z7901 Long term (current) use of anticoagulants: Secondary | ICD-10-CM | POA: Insufficient documentation

## 2011-11-12 DIAGNOSIS — Z87891 Personal history of nicotine dependence: Secondary | ICD-10-CM | POA: Insufficient documentation

## 2011-11-12 DIAGNOSIS — I2510 Atherosclerotic heart disease of native coronary artery without angina pectoris: Secondary | ICD-10-CM | POA: Insufficient documentation

## 2011-11-12 DIAGNOSIS — I10 Essential (primary) hypertension: Secondary | ICD-10-CM | POA: Insufficient documentation

## 2011-11-12 DIAGNOSIS — K219 Gastro-esophageal reflux disease without esophagitis: Secondary | ICD-10-CM | POA: Insufficient documentation

## 2011-11-12 DIAGNOSIS — Z5189 Encounter for other specified aftercare: Secondary | ICD-10-CM | POA: Insufficient documentation

## 2011-11-12 DIAGNOSIS — I509 Heart failure, unspecified: Secondary | ICD-10-CM | POA: Insufficient documentation

## 2011-11-12 DIAGNOSIS — E785 Hyperlipidemia, unspecified: Secondary | ICD-10-CM | POA: Insufficient documentation

## 2011-11-12 DIAGNOSIS — IMO0002 Reserved for concepts with insufficient information to code with codable children: Secondary | ICD-10-CM | POA: Insufficient documentation

## 2011-11-12 DIAGNOSIS — J449 Chronic obstructive pulmonary disease, unspecified: Secondary | ICD-10-CM | POA: Insufficient documentation

## 2011-11-16 ENCOUNTER — Ambulatory Visit (INDEPENDENT_AMBULATORY_CARE_PROVIDER_SITE_OTHER): Payer: Medicare Other

## 2011-11-16 DIAGNOSIS — I480 Paroxysmal atrial fibrillation: Secondary | ICD-10-CM

## 2011-11-16 DIAGNOSIS — I4891 Unspecified atrial fibrillation: Secondary | ICD-10-CM

## 2011-11-16 LAB — POCT INR: INR: 2.7

## 2011-11-17 ENCOUNTER — Encounter (HOSPITAL_COMMUNITY)
Admission: RE | Admit: 2011-11-17 | Discharge: 2011-11-17 | Disposition: A | Discharge status: Home / Self Care | Payer: Medicare Other | Source: Ambulatory Visit | Attending: Internal Medicine | Admitting: Internal Medicine

## 2011-11-19 ENCOUNTER — Encounter (HOSPITAL_COMMUNITY): Payer: Medicare Other

## 2011-11-24 ENCOUNTER — Encounter (HOSPITAL_COMMUNITY)
Admission: RE | Admit: 2011-11-24 | Discharge: 2011-11-24 | Disposition: A | Discharge status: Home / Self Care | Payer: Medicare Other | Source: Ambulatory Visit | Attending: Internal Medicine | Admitting: Internal Medicine

## 2011-11-26 ENCOUNTER — Encounter (HOSPITAL_COMMUNITY): Payer: Medicare Other

## 2011-12-01 ENCOUNTER — Encounter (HOSPITAL_COMMUNITY)
Admission: RE | Admit: 2011-12-01 | Discharge: 2011-12-01 | Disposition: A | Discharge status: Home / Self Care | Payer: Medicare Other | Source: Ambulatory Visit | Attending: Internal Medicine | Admitting: Internal Medicine

## 2011-12-03 ENCOUNTER — Encounter (HOSPITAL_COMMUNITY)
Admission: RE | Admit: 2011-12-03 | Discharge: 2011-12-03 | Disposition: A | Discharge status: Home / Self Care | Payer: Medicare Other | Source: Ambulatory Visit | Attending: Internal Medicine | Admitting: Internal Medicine

## 2011-12-08 ENCOUNTER — Encounter (HOSPITAL_COMMUNITY): Payer: Medicare Other

## 2011-12-10 ENCOUNTER — Encounter (HOSPITAL_COMMUNITY)
Admission: RE | Admit: 2011-12-10 | Discharge: 2011-12-10 | Disposition: A | Discharge status: Home / Self Care | Payer: Medicare Other | Source: Ambulatory Visit | Attending: Internal Medicine | Admitting: Internal Medicine

## 2011-12-10 DIAGNOSIS — IMO0002 Reserved for concepts with insufficient information to code with codable children: Secondary | ICD-10-CM | POA: Insufficient documentation

## 2011-12-10 DIAGNOSIS — I4891 Unspecified atrial fibrillation: Secondary | ICD-10-CM | POA: Insufficient documentation

## 2011-12-10 DIAGNOSIS — E785 Hyperlipidemia, unspecified: Secondary | ICD-10-CM | POA: Insufficient documentation

## 2011-12-10 DIAGNOSIS — I080 Rheumatic disorders of both mitral and aortic valves: Secondary | ICD-10-CM | POA: Insufficient documentation

## 2011-12-10 DIAGNOSIS — J4489 Other specified chronic obstructive pulmonary disease: Secondary | ICD-10-CM | POA: Insufficient documentation

## 2011-12-10 DIAGNOSIS — Z9861 Coronary angioplasty status: Secondary | ICD-10-CM | POA: Insufficient documentation

## 2011-12-10 DIAGNOSIS — Z7901 Long term (current) use of anticoagulants: Secondary | ICD-10-CM | POA: Insufficient documentation

## 2011-12-10 DIAGNOSIS — I4510 Unspecified right bundle-branch block: Secondary | ICD-10-CM | POA: Insufficient documentation

## 2011-12-10 DIAGNOSIS — K219 Gastro-esophageal reflux disease without esophagitis: Secondary | ICD-10-CM | POA: Insufficient documentation

## 2011-12-10 DIAGNOSIS — I509 Heart failure, unspecified: Secondary | ICD-10-CM | POA: Insufficient documentation

## 2011-12-10 DIAGNOSIS — Z7982 Long term (current) use of aspirin: Secondary | ICD-10-CM | POA: Insufficient documentation

## 2011-12-10 DIAGNOSIS — I2510 Atherosclerotic heart disease of native coronary artery without angina pectoris: Secondary | ICD-10-CM | POA: Insufficient documentation

## 2011-12-10 DIAGNOSIS — Z87891 Personal history of nicotine dependence: Secondary | ICD-10-CM | POA: Insufficient documentation

## 2011-12-10 DIAGNOSIS — Z79899 Other long term (current) drug therapy: Secondary | ICD-10-CM | POA: Insufficient documentation

## 2011-12-10 DIAGNOSIS — I5032 Chronic diastolic (congestive) heart failure: Secondary | ICD-10-CM | POA: Insufficient documentation

## 2011-12-10 DIAGNOSIS — I079 Rheumatic tricuspid valve disease, unspecified: Secondary | ICD-10-CM | POA: Insufficient documentation

## 2011-12-10 DIAGNOSIS — I10 Essential (primary) hypertension: Secondary | ICD-10-CM | POA: Insufficient documentation

## 2011-12-10 DIAGNOSIS — J449 Chronic obstructive pulmonary disease, unspecified: Secondary | ICD-10-CM | POA: Insufficient documentation

## 2011-12-10 DIAGNOSIS — Z5189 Encounter for other specified aftercare: Secondary | ICD-10-CM | POA: Insufficient documentation

## 2011-12-15 ENCOUNTER — Encounter (HOSPITAL_COMMUNITY)
Admission: RE | Admit: 2011-12-15 | Discharge: 2011-12-15 | Disposition: A | Discharge status: Home / Self Care | Payer: Medicare Other | Source: Ambulatory Visit | Attending: Internal Medicine | Admitting: Internal Medicine

## 2011-12-17 ENCOUNTER — Encounter (HOSPITAL_COMMUNITY)
Admission: RE | Admit: 2011-12-17 | Discharge: 2011-12-17 | Disposition: A | Discharge status: Home / Self Care | Payer: Medicare Other | Source: Ambulatory Visit | Attending: Internal Medicine | Admitting: Internal Medicine

## 2011-12-22 ENCOUNTER — Encounter (HOSPITAL_COMMUNITY)
Admission: RE | Admit: 2011-12-22 | Discharge: 2011-12-22 | Disposition: A | Discharge status: Home / Self Care | Payer: Medicare Other | Source: Ambulatory Visit | Attending: Internal Medicine | Admitting: Internal Medicine

## 2011-12-24 ENCOUNTER — Encounter (HOSPITAL_COMMUNITY): Payer: Medicare Other

## 2011-12-28 ENCOUNTER — Other Ambulatory Visit: Payer: Medicare Other

## 2011-12-28 ENCOUNTER — Ambulatory Visit: Payer: Medicare Other | Admitting: Cardiology

## 2011-12-29 ENCOUNTER — Encounter (HOSPITAL_COMMUNITY): Payer: Medicare Other

## 2011-12-31 ENCOUNTER — Encounter (HOSPITAL_COMMUNITY)
Admission: RE | Admit: 2011-12-31 | Discharge: 2011-12-31 | Disposition: A | Discharge status: Home / Self Care | Payer: Medicare Other | Source: Ambulatory Visit | Attending: Internal Medicine | Admitting: Internal Medicine

## 2012-01-07 ENCOUNTER — Encounter (HOSPITAL_COMMUNITY)
Admission: RE | Admit: 2012-01-07 | Discharge: 2012-01-07 | Disposition: A | Discharge status: Home / Self Care | Payer: Medicare Other | Source: Ambulatory Visit | Attending: Internal Medicine | Admitting: Internal Medicine

## 2012-01-07 NOTE — Progress Notes (Signed)
Scott Reese 78 y.o. male     Nutrition Note Spoke with pt. Pt is obese. Pt states he lost 15# in the "early spring" when he was sick. Per pt, "I've gained most of it back." According to nutrition screen, pt wants to lose wt. Per discussion with pt, pt is not interested in changing his eating habits at this time to promote wt loss. Pt eats 1 meal and several snacks a day. Pt eats out daily "because my wife no longer cooks." Pt reports his wife was dx with Pulmonary HTN 14 yrs ago. Pt making healthy food choices the majority of the time. Pt continues to drink more than 3 alcoholic beverages "3-4 times a week." Pt's Rate Your Plate results reviewed with pt.  Pt expressed understanding.  Pt does not avoid salty food due to frequency of eating out. Pt declined nutrition information re: how to make healthier choices when eating out.  Pt states he "occasionally" adds salt to food.  The role of sodium in lung disease reviewed with pt.   Nutrition Diagnosis   Excessive sodium intake related to over consumption of processed food as evidenced by eating out frequently.   Food-and nutrition-related knowledge deficit related to lack of exposure to information as related to diagnosis of pulmonary disease Nutrition Rx/Est. Daily Nutrition Needs for: ? wt  maintenance 2200-2350 Kcal 9-0 gm protein   1500 mg or less sodium      Nutrition Intervention   Pt's individual nutrition plan and goals reviewed with pt.   Benefits of adopting healthy eating habits discussed when pt's Rate Your Plate reviewed.   Pt to attend the Nutrition and Lung Disease class - met 10/01/11   Continual client-centered nutrition education by RD, as part of interdisciplinary care. Goal(s) 1. Pt to identify and limit food sources of sodium. Monitor and Evaluate progress toward nutrition goal with team.             Pulmonary Rehab Nutrition Screen Pt section of Nutrition Screen in hard chart. Ht: 65" Ht Readings from Last 1 Encounters:    09/28/11 5\' 8"  (1.727 m)    Wt:   207.2 lb (94.2 kg) Wt Readings from Last 3 Encounters:  09/28/11 205 lb (92.987 kg)  08/05/11 197 lb (89.359 kg)  07/15/11 191 lb 6.4 oz (86.818 kg)    BMI: 34.6  31.8%body fat                       Rate Your Plate Score: 56  Diagnosis: COPD Past Medical History  Diagnosis Date  . COPD (chronic obstructive pulmonary disease)     Severe  . Hyperlipidemia   . Diastolic CHF   . Hypertension   . GERD (gastroesophageal reflux disease)   . Alcohol abuse   . Diastolic heart failure April 2013    Normal EF, mild AR, mild MR, moderate LAE, mod-severely RAE and severe TR  . Chronic anticoagulation     on Coumadin  . Atrial fibrillation   . Coronary artery disease     40-50% LCx by cath 2004   Lipid Panel     Component Value Date/Time   CHOL 149 09/28/2011 1056   TRIG 98.0 09/28/2011 1056   HDL 50.40 09/28/2011 1056   CHOLHDL 3 09/28/2011 1056   VLDL 19.6 09/28/2011 1056   LDLCALC 79 09/28/2011 1056   No results found for this basename: HGBA1C   Nutrition Risk Level:  Low

## 2012-01-12 ENCOUNTER — Encounter (HOSPITAL_COMMUNITY)
Admission: RE | Admit: 2012-01-12 | Discharge: 2012-01-12 | Disposition: A | Discharge status: Home / Self Care | Payer: Medicare Other | Source: Ambulatory Visit | Attending: Internal Medicine | Admitting: Internal Medicine

## 2012-01-12 DIAGNOSIS — Z7982 Long term (current) use of aspirin: Secondary | ICD-10-CM | POA: Insufficient documentation

## 2012-01-12 DIAGNOSIS — I080 Rheumatic disorders of both mitral and aortic valves: Secondary | ICD-10-CM | POA: Insufficient documentation

## 2012-01-12 DIAGNOSIS — Z7901 Long term (current) use of anticoagulants: Secondary | ICD-10-CM | POA: Insufficient documentation

## 2012-01-12 DIAGNOSIS — I509 Heart failure, unspecified: Secondary | ICD-10-CM | POA: Insufficient documentation

## 2012-01-12 DIAGNOSIS — Z87891 Personal history of nicotine dependence: Secondary | ICD-10-CM | POA: Insufficient documentation

## 2012-01-12 DIAGNOSIS — I5032 Chronic diastolic (congestive) heart failure: Secondary | ICD-10-CM | POA: Insufficient documentation

## 2012-01-12 DIAGNOSIS — I4510 Unspecified right bundle-branch block: Secondary | ICD-10-CM | POA: Insufficient documentation

## 2012-01-12 DIAGNOSIS — IMO0002 Reserved for concepts with insufficient information to code with codable children: Secondary | ICD-10-CM | POA: Insufficient documentation

## 2012-01-12 DIAGNOSIS — I4891 Unspecified atrial fibrillation: Secondary | ICD-10-CM | POA: Insufficient documentation

## 2012-01-12 DIAGNOSIS — J4489 Other specified chronic obstructive pulmonary disease: Secondary | ICD-10-CM | POA: Insufficient documentation

## 2012-01-12 DIAGNOSIS — J449 Chronic obstructive pulmonary disease, unspecified: Secondary | ICD-10-CM | POA: Insufficient documentation

## 2012-01-12 DIAGNOSIS — E785 Hyperlipidemia, unspecified: Secondary | ICD-10-CM | POA: Insufficient documentation

## 2012-01-12 DIAGNOSIS — I079 Rheumatic tricuspid valve disease, unspecified: Secondary | ICD-10-CM | POA: Insufficient documentation

## 2012-01-12 DIAGNOSIS — K219 Gastro-esophageal reflux disease without esophagitis: Secondary | ICD-10-CM | POA: Insufficient documentation

## 2012-01-12 DIAGNOSIS — I10 Essential (primary) hypertension: Secondary | ICD-10-CM | POA: Insufficient documentation

## 2012-01-12 DIAGNOSIS — I2510 Atherosclerotic heart disease of native coronary artery without angina pectoris: Secondary | ICD-10-CM | POA: Insufficient documentation

## 2012-01-12 DIAGNOSIS — Z5189 Encounter for other specified aftercare: Secondary | ICD-10-CM | POA: Insufficient documentation

## 2012-01-12 DIAGNOSIS — Z79899 Other long term (current) drug therapy: Secondary | ICD-10-CM | POA: Insufficient documentation

## 2012-01-12 DIAGNOSIS — Z9861 Coronary angioplasty status: Secondary | ICD-10-CM | POA: Insufficient documentation

## 2012-01-13 NOTE — Progress Notes (Signed)
Pulmonary Rehabilitation Program Outcomes Report   Orientation:  09/24/2011 Graduate Date:  01/12/2012 Discharge Date:  01/12/2012 # of sessions completed: 24  Pulmonologist: Ramaswamy Class Time:  10:30am   A.  Exercise Program:  Tolerates exercise @ 2.76 METS for 45 minutes, Walk Test Results:  Pre: 889ft and Post: 1217ft, Improved functional capacity  46.34 %, Decreased  muscular strength  -5.88 %, Improved dyspnea score 31.82 %, Improved education score 6.00 %, Exercise limited by dyspnea, Patient plans to continue pulmonary rehab maintenance program, Discharged to home exercise program.  Anticipated compliance:  good and Discharged  B.  Mental Health:  Quality of Life (QOL)  changes:  Overall  +356.90 %, Health/Functioning +898.17 %, Socioeconomics -34.71 %, Psych/Spiritual +216.39 %, Family +16.38 %    C.  Education/Instruction/Skills  Uses Perceived Exertion Scale and/or Dyspnea Scale and Attended 8 education classes  Demonstrates accurate diaphragmatic breathing and pursed lip breathing. Home exercise given on 10/12/11.  D.  Nutrition/Weight Control/Body Composition:  Pt making many healthy food choices. Pt drinks more than 3 ETOH beverages most days of the week.  Pt wt maintained during rehab. BMI 33.7 Section Completed by: Mickle Plumb, M.Ed, RD, LDN, CDE    E.  Blood Lipids Lab Results  Component Value Date   CHOL 149 09/28/2011   HDL 50.40 09/28/2011   LDLCALC 79 09/28/2011   LDLDIRECT 69.7 03/18/2011   TRIG 98.0 09/28/2011   CHOLHDL 3 09/28/2011   F.  Lifestyle Changes:  Making positive lifestyle changes  G.  Symptoms noted with exercise:  Questionable angina, Shortness of breath, Fatigue and Exertional hypertension  Report Completed By:  Frederik Schmidt. Allred  Comments:  Patient has completed 24 sessions accomplishing all personal and program goals. Patient is now able to do his ADLs without becoming dyspneic. As shown above, Mr. Racz improved in  all aspects of rehab. He increased his workloads and increased his met levels each week., Patient did lose weight as he wanted to. Patient had a major overall improvement in quality of life. Patient satisfied with his outcome report and plans to continue exercising at home. Patient stated he will come to our maintenance program after the holidays. Very compliant and great to work with.      Agree with above and hope we can get him back for maintenance as this really made a difference for him.   Cathie Olden RN

## 2012-01-14 ENCOUNTER — Encounter (HOSPITAL_COMMUNITY): Admission: RE | Admit: 2012-01-14 | Payer: Medicare Other | Source: Ambulatory Visit

## 2012-01-19 ENCOUNTER — Encounter (HOSPITAL_COMMUNITY): Payer: Medicare Other

## 2012-01-21 ENCOUNTER — Encounter (HOSPITAL_COMMUNITY): Payer: Medicare Other

## 2012-01-26 ENCOUNTER — Encounter (HOSPITAL_COMMUNITY): Payer: Medicare Other

## 2012-01-29 ENCOUNTER — Ambulatory Visit (INDEPENDENT_AMBULATORY_CARE_PROVIDER_SITE_OTHER): Payer: Medicare Other | Admitting: Cardiology

## 2012-01-29 ENCOUNTER — Encounter: Payer: Self-pay | Admitting: Cardiology

## 2012-01-29 ENCOUNTER — Ambulatory Visit (INDEPENDENT_AMBULATORY_CARE_PROVIDER_SITE_OTHER): Payer: Medicare Other | Admitting: *Deleted

## 2012-01-29 ENCOUNTER — Telehealth: Payer: Self-pay | Admitting: Internal Medicine

## 2012-01-29 ENCOUNTER — Other Ambulatory Visit (INDEPENDENT_AMBULATORY_CARE_PROVIDER_SITE_OTHER): Payer: Medicare Other

## 2012-01-29 VITALS — BP 128/86 | HR 70 | Ht 68.0 in | Wt 207.0 lb

## 2012-01-29 DIAGNOSIS — R1013 Epigastric pain: Secondary | ICD-10-CM

## 2012-01-29 DIAGNOSIS — E785 Hyperlipidemia, unspecified: Secondary | ICD-10-CM

## 2012-01-29 DIAGNOSIS — J449 Chronic obstructive pulmonary disease, unspecified: Secondary | ICD-10-CM

## 2012-01-29 DIAGNOSIS — E7800 Pure hypercholesterolemia, unspecified: Secondary | ICD-10-CM

## 2012-01-29 DIAGNOSIS — I482 Chronic atrial fibrillation, unspecified: Secondary | ICD-10-CM

## 2012-01-29 DIAGNOSIS — I4891 Unspecified atrial fibrillation: Secondary | ICD-10-CM

## 2012-01-29 DIAGNOSIS — I2510 Atherosclerotic heart disease of native coronary artery without angina pectoris: Secondary | ICD-10-CM

## 2012-01-29 DIAGNOSIS — I480 Paroxysmal atrial fibrillation: Secondary | ICD-10-CM

## 2012-01-29 LAB — CBC WITH DIFFERENTIAL/PLATELET
Basophils Relative: 0.3 % (ref 0.0–3.0)
Eosinophils Relative: 1.1 % (ref 0.0–5.0)
Lymphocytes Relative: 13.6 % (ref 12.0–46.0)
MCV: 92.5 fl (ref 78.0–100.0)
Monocytes Absolute: 0.5 10*3/uL (ref 0.1–1.0)
Monocytes Relative: 8.2 % (ref 3.0–12.0)
Neutrophils Relative %: 76.8 % (ref 43.0–77.0)
Platelets: 149 10*3/uL — ABNORMAL LOW (ref 150.0–400.0)
RBC: 4.32 Mil/uL (ref 4.22–5.81)
WBC: 6.7 10*3/uL (ref 4.5–10.5)

## 2012-01-29 LAB — BASIC METABOLIC PANEL
Chloride: 101 mEq/L (ref 96–112)
GFR: 55.15 mL/min — ABNORMAL LOW (ref >60.00)
Glucose, Bld: 112 mg/dL — ABNORMAL HIGH (ref 70–99)
Potassium: 4.1 mEq/L (ref 3.5–5.1)
Sodium: 140 mEq/L (ref 135–145)

## 2012-01-29 LAB — LIPID PANEL
HDL: 41.4 mg/dL (ref >39.00)
LDL Cholesterol: 72 mg/dL (ref 0–99)
VLDL: 32.4 mg/dL (ref 0.0–40.0)

## 2012-01-29 LAB — HEPATIC FUNCTION PANEL
ALT: 29 U/L (ref 0–53)
AST: 33 U/L (ref 0–37)
Albumin: 3.5 g/dL (ref 3.5–5.2)
Alkaline Phosphatase: 47 U/L (ref 39–117)
Total Bilirubin: 0.5 mg/dL (ref 0.3–1.2)

## 2012-01-29 LAB — POCT INR: INR: 3.6

## 2012-01-29 MED ORDER — WARFARIN SODIUM 5 MG PO TABS: ORAL_TABLET | ORAL | Status: DC

## 2012-01-29 NOTE — Patient Instructions (Addendum)
Will have you go to coumadin clinic today  Will obtain labs today and call you with the results (lp/bmet/hfp)  Your physician recommends that you continue on your current medications as directed. Please refer to the Current Medication list given to you today.  Your physician recommends that you schedule a follow-up appointment in: 4 months

## 2012-01-29 NOTE — Assessment & Plan Note (Signed)
The patient has a history of dyspepsia.  His symptoms have been stable and quiescent recently

## 2012-01-29 NOTE — Assessment & Plan Note (Signed)
Chest pain or angina pectoris.

## 2012-01-29 NOTE — Assessment & Plan Note (Signed)
The patient has a history of COPD.  He finished the outpatient pulmonary rehabilitation program.  He was invited to stay on maintenance program but has not made a decision about that yet.  Feels that his overall breathing has improved significantly since being in the rehabilitation program.

## 2012-01-29 NOTE — Progress Notes (Signed)
Scott Reese Date of Birth:  03/22/1927 [facility] [address] [place], [place]  [address] [phone]         Fax   [phone]  History of Present Illness: This pleasant 78 year old gentleman is seen for a scheduled followup office visit.  The patient has a history of known ischemic heart disease. He has stable exertional angina pectoris. He is a former smoker and does have COPD. He has moderate cardiomegaly but no active symptoms of heart failure at this time. His last nuclear stress test in 2011 showed normal perfusion and his ejection fraction was 61%. A previous echocardiogram in 2005 had shown moderate mitral regurgitation. He has been in chronic atrial fib and is on long-term Coumadin.  Since last visit the patient has not been experiencing any new cardiac symptoms.   Current Outpatient Prescriptions  Medication Sig Dispense Refill  . allopurinol (ZYLOPRIM) 300 MG tablet Take 1 tablet (300 mg total) by mouth daily.  90 tablet  3  . atorvastatin (LIPITOR) 10 MG tablet Take 1 tablet (10 mg total) by mouth daily.  90 tablet  3  . budesonide-formoterol (SYMBICORT) 160-4.5 MCG/ACT inhaler Inhale 2 puffs into the lungs 2 (two) times daily.      . furosemide (LASIX) 40 MG tablet Take 1 tablet (40 mg total) by mouth 2 (two) times daily.  180 tablet  3  . guaiFENesin (MUCINEX) 600 MG 12 hr tablet Take 1 tablet (600 mg total) by mouth 2 (two) times daily as needed for congestion.  60 tablet  1  . metoprolol (LOPRESSOR) 100 MG tablet Take 50 mg by mouth daily.      . nitroGLYCERIN (NITROSTAT) 0.4 MG SL tablet Place 1 tablet (0.4 mg total) under the tongue every 5 (five) minutes as needed. For chest pain.  25 tablet  3  . potassium chloride SA (K-DUR,KLOR-CON) 20 MEQ tablet Take 1 tablet (20 mEq total) by mouth daily.  90 tablet  3  . tiotropium (SPIRIVA) 18 MCG inhalation capsule Place 1 capsule (18 mcg total) into inhaler and inhale daily.  30 capsule  6  .  [DISCONTINUED] metoprolol (LOPRESSOR) 100 MG tablet Take 0.5 tablets (50 mg total) by mouth 2 (two) times daily.  90 tablet  3  . warfarin (COUMADIN) 5 MG tablet Take as directed by Anticoagulation Clinic  35 tablet  0    No Known Allergies  Patient Active Problem List  Diagnosis  . Coronary artery disease  . Dyspnea  . COPD exacerbation  . Paroxysmal atrial fibrillation  . CHF (congestive heart failure)  . Hyperlipidemia  . Dyspepsia  . Chronic atrial fibrillation  . Dyslipidemia  . Dizziness  . Cough  . COPD (chronic obstructive pulmonary disease)  . Alcohol abuse    History  Smoking status  . Former Smoker -- 2.0 packs/day for 40 years  . Types: Cigarettes  . Quit date: 03/09/1989  Smokeless tobacco  . Never Used    History  Alcohol Use  . 1.2 oz/week  . 2 Shots of liquor per week    Comment: 1/5 per week per pt    Family History  Problem Relation Age of Onset  . Diabetes Father   . Stroke Brother   . Cancer Daughter     Review of Systems: Constitutional: no fever chills diaphoresis or fatigue or change in weight.  Head and neck: no hearing loss, no epistaxis, no photophobia or visual disturbance. Respiratory: No cough, shortness of breath or  wheezing. Cardiovascular: No chest pain peripheral edema, palpitations. Gastrointestinal: No abdominal distention, no abdominal pain, no change in bowel habits hematochezia or melena. Genitourinary: No dysuria, no frequency, no urgency, no nocturia. Musculoskeletal:No arthralgias, no back pain, no gait disturbance or myalgias. Neurological: No dizziness, no headaches, no numbness, no seizures, no syncope, no weakness, no tremors. Hematologic: No lymphadenopathy, no easy bruising. Psychiatric: No confusion, no hallucinations, no sleep disturbance.    Physical Exam: Filed Vitals:   01/29/12 0903  BP: 128/86  Pulse: 70   the general appearance reveals an elderly gentleman in no distress.  He is not dyspneic at  rest off oxygen.The head and neck exam reveals pupils equal and reactive.  Extraocular movements are full.  There is no scleral icterus.  The mouth and pharynx are normal.  The neck is supple.  The carotids reveal no bruits.  The jugular venous pressure is normal.  The  thyroid is not enlarged.  There is no lymphadenopathy.  The chest is clear to percussion and auscultation.  There are minimal scattered expiratory rhonchi.  Expansion of the chest is symmetrical.  The precordium is quiet.  The pulse is irregularly irregular .The first heart sound is normal.  The second heart sound is physiologically split.  There is no murmur gallop rub or click.  There is no abnormal lift or heave.  The abdomen is soft and nontender.  The bowel sounds are normal.  The liver and spleen are not enlarged.  There are no abdominal masses.  There are no abdominal bruits.  Extremities reveal good pedal pulses.  There is no phlebitis or edema.  There is no cyanosis or clubbing.  Strength is normal and symmetrical in all extremities.  There is no lateralizing weakness.  There are no sensory deficits.  The skin is warm and dry.  There is no rash.  EKG shows atrial fibrillation with a controlled ventricular response of 70.  He has a pattern of bifascicular block.  No change since 06/28/11 except that his heart rate is slower.   Assessment / Plan: Continue same medication.  Blood work today pending.  Recheck in 4 months for followup office visit.

## 2012-01-29 NOTE — Assessment & Plan Note (Signed)
The patient is in chronic atrial fibrillation.  He has not had any thromboembolic episodes.  He remains on long-term Coumadin.  He denies any exertional chest pain.

## 2012-01-29 NOTE — Telephone Encounter (Signed)
RA please advise if you are ok to accept this pt.  thanks

## 2012-01-29 NOTE — Assessment & Plan Note (Signed)
The patient has a history of dyslipidemia.  We're checking his cholesterol today.  He is attempting to stay on a low-cholesterol diet

## 2012-01-29 NOTE — Telephone Encounter (Signed)
I spoke with pt and he stated he wants to switch from MR to RA. He stated was not for any particular reason. Pt aware of our protocol for switching providers. Please advise MR thanks

## 2012-01-29 NOTE — Telephone Encounter (Signed)
That is fine 

## 2012-01-31 NOTE — Progress Notes (Signed)
Quick Note:  Please report to patient. The recent labs are stable. Continue same medication and careful diet. ______ 

## 2012-02-01 ENCOUNTER — Telehealth: Payer: Self-pay | Admitting: *Deleted

## 2012-02-01 NOTE — Telephone Encounter (Signed)
Advised patient of lab results  

## 2012-02-01 NOTE — Telephone Encounter (Signed)
Message copied by Burnell Blanks on Mon Feb 01, 2012  6:25 PM ------      Message from: Cassell Clement      Created: Sun Jan 31, 2012  5:56 PM       Please report to patient.  The recent labs are stable. Continue same medication and careful diet.

## 2012-02-03 MED ORDER — TIOTROPIUM BROMIDE MONOHYDRATE 18 MCG IN CAPS: 18.0000 ug | ORAL_CAPSULE | Freq: Every day | RESPIRATORY_TRACT | Status: DC

## 2012-02-03 MED ORDER — BUDESONIDE-FORMOTEROL FUMARATE 160-4.5 MCG/ACT IN AERO: 2.0000 | INHALATION_SPRAY | Freq: Two times a day (BID) | RESPIRATORY_TRACT | Status: DC

## 2012-02-03 NOTE — Telephone Encounter (Signed)
Called, spoke with pt.  Pt is calling to check on the status of this.  Pt states he was told he would get a call back regarding this on 11/25.  I apologized to pt for this and advised RA is in the office this evening and assured him we will call him back today.  RA, pls advise.  Thank you.  Pt also states he only has 2 days of symbicort and spiriva left.  He would like rxs to be sent to [facility].  Rxs sent -- pt aware.

## 2012-02-03 NOTE — Telephone Encounter (Signed)
Your booked today. You have 5 consults and 1 HFU.

## 2012-02-03 NOTE — Telephone Encounter (Signed)
I have scheduled pt to see RA 02/25/12 at 2:30. Pt aware to arrive 15 min early

## 2012-02-03 NOTE — Telephone Encounter (Signed)
Ok with me  - I see openings today

## 2012-02-03 NOTE — Telephone Encounter (Signed)
Pt states he would like an update on this.  Pt states he was told he would get a call back on 11/25 to schedule an appt.  Pt states he only has 2 days of meds left.  Antionette Fairy

## 2012-02-03 NOTE — Telephone Encounter (Signed)
Next available ok?

## 2012-02-19 ENCOUNTER — Ambulatory Visit (INDEPENDENT_AMBULATORY_CARE_PROVIDER_SITE_OTHER): Payer: Medicare Other

## 2012-02-19 DIAGNOSIS — I480 Paroxysmal atrial fibrillation: Secondary | ICD-10-CM

## 2012-02-19 DIAGNOSIS — I4891 Unspecified atrial fibrillation: Secondary | ICD-10-CM

## 2012-02-19 LAB — POCT INR: INR: 3.3

## 2012-02-25 ENCOUNTER — Other Ambulatory Visit (INDEPENDENT_AMBULATORY_CARE_PROVIDER_SITE_OTHER): Payer: Medicare Other

## 2012-02-25 ENCOUNTER — Encounter: Payer: Self-pay | Admitting: Pulmonary Disease

## 2012-02-25 ENCOUNTER — Telehealth: Payer: Self-pay | Admitting: *Deleted

## 2012-02-25 ENCOUNTER — Ambulatory Visit (INDEPENDENT_AMBULATORY_CARE_PROVIDER_SITE_OTHER): Payer: Medicare Other | Admitting: Pulmonary Disease

## 2012-02-25 VITALS — BP 124/76 | HR 72 | Temp 98.1°F | Ht 68.0 in | Wt 210.8 lb

## 2012-02-25 DIAGNOSIS — J8410 Pulmonary fibrosis, unspecified: Secondary | ICD-10-CM

## 2012-02-25 DIAGNOSIS — J849 Interstitial pulmonary disease, unspecified: Secondary | ICD-10-CM

## 2012-02-25 DIAGNOSIS — R0600 Dyspnea, unspecified: Secondary | ICD-10-CM

## 2012-02-25 DIAGNOSIS — J449 Chronic obstructive pulmonary disease, unspecified: Secondary | ICD-10-CM

## 2012-02-25 DIAGNOSIS — R0989 Other specified symptoms and signs involving the circulatory and respiratory systems: Secondary | ICD-10-CM

## 2012-02-25 DIAGNOSIS — I480 Paroxysmal atrial fibrillation: Secondary | ICD-10-CM

## 2012-02-25 DIAGNOSIS — I4891 Unspecified atrial fibrillation: Secondary | ICD-10-CM

## 2012-02-25 NOTE — Telephone Encounter (Signed)
Will also forward to Dr. Vassie Loll so he is aware of this as well

## 2012-02-25 NOTE — Telephone Encounter (Signed)
Received a phonecall from lab--Eugena-- she states that the only lab order she saw come across at the time patients labs were drawn were INR.  She states she did not see Allergy Profile because it was not "future".  She also stated that patient has left already and needs to return for labwork.  ATC patient house number x2 line busy.  Also ATC patient cell number no answer and voicemail is not set up to leave messages.  Will forward to Mindy to follow up on.

## 2012-02-25 NOTE — Assessment & Plan Note (Addendum)
Dyspnea seems to be out of proportion to his PFts & worse than his baseline. Will look for pulm fibrosis, pulm htn or hypoxia as causes You may need an oxygen test during sleep With  lasix -aim for wt 204 lbs

## 2012-02-25 NOTE — Assessment & Plan Note (Addendum)
Gold c Bibasal crackles suspicious for ILD - some atx on CXR too  Stay on symbicort twice daily & spiriva once daily Blood work for allergies OK to take xopenex nebs once daily - call us once out & we can call in generic albuterol pulm rehab maintenance

## 2012-02-25 NOTE — Patient Instructions (Addendum)
Stay on symbicort twice daily & spiriva once daily Blood work for allergies CT scan of the chest for fluid vs scar tissue You may need an oxygen test during sleep Take lasix -aim for wt 204 lbs OK to take xopenex nebs once daily - call us once out & we can call in generic albuterol

## 2012-02-25 NOTE — Telephone Encounter (Signed)
Pl let thim know that allergy test not drawn by lab OK to defer allergy test until his next blood draw if he desires.

## 2012-02-25 NOTE — Progress Notes (Signed)
  Subjective:    Patient ID: Scott Reese, male    DOB: 26-Mar-1927, 78 y.o.   MRN: [id number]  HPI 84/M for FU of gold C COPD  Admitted 4/13  for COPD flare,  Acute respiratory failure  Former heavy smoker - quit '91 who lives with 13 cats  1. Severe COPD- IgE 12.4, Spirometry 5/13  FEv1 is 1.16L/39% 2. chronic diastolic CHF - EF 55%, CAD - 40-50% LCx by cath 2004  3. Chronic atrial fibrillation, on Coumadin  4. HTN  5. Transient delirium felt secondary to EtOH withdrawal vs steroids   02/26/2012 71m FU He is switching drs from MR to me.  Reports dyspnea gr 3 on walking around the house & climbing stairs.  Symbicort  & spiriva copays are expensive. At the same time, he does not mind spending $150 on his cat's asthma. Xopenex nebs were very expensive but he got some samples from a relative. CXR 4/13 shows cardiomegaly with mild increased central venous congestion. He denies PND< orthopnea or pedal edema. He is compliant with lasix daily & denies wt gain. Completed pulm rehab but did not enroll in maintenance program  Past Medical History  Diagnosis Date  . COPD (chronic obstructive pulmonary disease)     Severe  . Hyperlipidemia   . Diastolic CHF   . Hypertension   . GERD (gastroesophageal reflux disease)   . Alcohol abuse   . Diastolic heart failure April 2013    Normal EF, mild AR, mild MR, moderate LAE, mod-severely RAE and severe TR  . Chronic anticoagulation     on Coumadin  . Atrial fibrillation   . Coronary artery disease     40-50% LCx by cath 2004     Review of Systems  Constitutional: Negative for appetite change and unexpected weight change.  HENT: Positive for congestion. Negative for ear pain, sore throat, sneezing, trouble swallowing and dental problem.   Respiratory: Positive for cough and shortness of breath.   Cardiovascular: Positive for leg swelling. Negative for chest pain and palpitations.  Gastrointestinal: Negative for abdominal pain.   Musculoskeletal: Positive for arthralgias. Negative for joint swelling.  Skin: Negative for rash.  Neurological: Negative for headaches.  Psychiatric/Behavioral: Negative for dysphoric mood. The patient is not nervous/anxious.        Objective:   Physical Exam  Gen. grumpy, well-nourished, in no distress, normal affect, dishevelled, bearded, cat hair on his jacket ENT - no lesions, no post nasal drip Neck: No JVD, no thyromegaly, no carotid bruits Lungs: no use of accessory muscles, no dullness to percussion, bibasal dry 1/3rales, no rhonchi  Cardiovascular: Rhythm regular, heart sounds  normal, no murmurs or gallops, no peripheral edema Abdomen: soft and non-tender, no hepatosplenomegaly, BS normal. Musculoskeletal: No deformities, no cyanosis or clubbing Neuro:  alert, non focal       Assessment & Plan:

## 2012-02-26 NOTE — Telephone Encounter (Signed)
Pt aware. He stated he has no problem coming back having this done. He will come on Monday. i advised will make my manager aware of this though. Nothing further was needed

## 2012-02-29 ENCOUNTER — Ambulatory Visit (INDEPENDENT_AMBULATORY_CARE_PROVIDER_SITE_OTHER)
Admission: RE | Admit: 2012-02-29 | Discharge: 2012-02-29 | Disposition: A | Discharge status: Home / Self Care | Payer: Medicare Other | Source: Ambulatory Visit | Attending: Pulmonary Disease | Admitting: Pulmonary Disease

## 2012-02-29 ENCOUNTER — Other Ambulatory Visit: Payer: Medicare Other

## 2012-02-29 DIAGNOSIS — J449 Chronic obstructive pulmonary disease, unspecified: Secondary | ICD-10-CM

## 2012-02-29 DIAGNOSIS — R0600 Dyspnea, unspecified: Secondary | ICD-10-CM

## 2012-02-29 DIAGNOSIS — J8410 Pulmonary fibrosis, unspecified: Secondary | ICD-10-CM

## 2012-02-29 DIAGNOSIS — J849 Interstitial pulmonary disease, unspecified: Secondary | ICD-10-CM

## 2012-02-29 DIAGNOSIS — R0989 Other specified symptoms and signs involving the circulatory and respiratory systems: Secondary | ICD-10-CM

## 2012-03-01 LAB — ALLERGY FULL PROFILE
Allergen,Goose feathers, e70: <0.1 kU/L
Alternaria Alternata: <0.1 kU/L
Bahia Grass: <0.1 kU/L
Bermuda Grass: <0.1 kU/L
Box Elder IgE: <0.1 kU/L
Candida Albicans: <0.1 kU/L
Cat Dander: <0.1 kU/L
Curvularia lunata: <0.1 kU/L
Dog Dander: <0.1 kU/L
Fescue: <0.1 kU/L
G005 Rye, Perennial: <0.1 kU/L
Goldenrod: <0.1 kU/L
Helminthosporium halodes: <0.1 kU/L
IgE (Immunoglobulin E), Serum: 7.4 IU/mL (ref 0.0–180.0)
Lamb's Quarters: <0.1 kU/L
Stemphylium Botryosum: <0.1 kU/L
Sycamore Tree: <0.1 kU/L

## 2012-03-03 ENCOUNTER — Telehealth: Payer: Self-pay | Admitting: Pulmonary Disease

## 2012-03-03 NOTE — Telephone Encounter (Signed)
Spoke with patient, patient wanted to know if he was to continue his "respiratory medications".   Per last OV note 02/25/12 Patient Instructions     Stay on symbicort twice daily & spiriva once daily  Blood work for allergies  CT scan of the chest for fluid vs scar tissue  You may need an oxygen test during sleep  Take lasix -aim for wt 204 lbs  OK to take xopenex nebs once daily - call us once out & we can call in generic albuterol    Informed patient of this and nothing further needed.

## 2012-03-05 ENCOUNTER — Other Ambulatory Visit: Payer: Self-pay | Admitting: Internal Medicine

## 2012-03-11 ENCOUNTER — Ambulatory Visit (INDEPENDENT_AMBULATORY_CARE_PROVIDER_SITE_OTHER): Payer: Medicare Other

## 2012-03-11 DIAGNOSIS — I4891 Unspecified atrial fibrillation: Secondary | ICD-10-CM

## 2012-03-11 DIAGNOSIS — I480 Paroxysmal atrial fibrillation: Secondary | ICD-10-CM

## 2012-03-11 LAB — POCT INR: INR: 2.1

## 2012-03-13 ENCOUNTER — Telehealth: Payer: Self-pay | Admitting: Pulmonary Disease

## 2012-03-13 DIAGNOSIS — J449 Chronic obstructive pulmonary disease, unspecified: Secondary | ICD-10-CM

## 2012-03-13 DIAGNOSIS — R0600 Dyspnea, unspecified: Secondary | ICD-10-CM

## 2012-03-13 NOTE — Telephone Encounter (Signed)
O2 satn dropped for about 50 mins during sleep. Can trial oxygen during sleep to see if this will help

## 2012-03-14 NOTE — Telephone Encounter (Signed)
Ok to reorder test

## 2012-03-14 NOTE — Telephone Encounter (Signed)
lmtcb x1 for pt--i have placed order

## 2012-03-14 NOTE — Telephone Encounter (Signed)
I spoke with pt and he states he woke up about a dozen times and the piece was off his finger. He does not believe the test was accurate and believes this may need to be redone. Please advise Dr. Vassie Loll thanks

## 2012-03-17 NOTE — Telephone Encounter (Signed)
Pt aware. Nothing further  Was needed

## 2012-03-25 ENCOUNTER — Encounter: Payer: Self-pay | Admitting: Pulmonary Disease

## 2012-04-08 ENCOUNTER — Ambulatory Visit (INDEPENDENT_AMBULATORY_CARE_PROVIDER_SITE_OTHER): Payer: Medicare Other | Admitting: *Deleted

## 2012-04-08 DIAGNOSIS — I4891 Unspecified atrial fibrillation: Secondary | ICD-10-CM

## 2012-04-08 DIAGNOSIS — I480 Paroxysmal atrial fibrillation: Secondary | ICD-10-CM

## 2012-04-11 ENCOUNTER — Other Ambulatory Visit: Payer: Self-pay | Admitting: Pulmonary Disease

## 2012-04-11 ENCOUNTER — Other Ambulatory Visit: Payer: Self-pay | Admitting: Internal Medicine

## 2012-04-13 ENCOUNTER — Telehealth: Payer: Self-pay | Admitting: Pulmonary Disease

## 2012-04-13 DIAGNOSIS — J441 Chronic obstructive pulmonary disease with (acute) exacerbation: Secondary | ICD-10-CM

## 2012-04-13 NOTE — Telephone Encounter (Signed)
lmomtcb x1 for pt 

## 2012-04-13 NOTE — Telephone Encounter (Signed)
ONO 1/26/114 shows mild desatn about 35 m < 88% May benefit from 1L o2 during sleep - OK to order if he is willing

## 2012-04-15 NOTE — Telephone Encounter (Signed)
lmomtcb  

## 2012-04-15 NOTE — Telephone Encounter (Signed)
lmomtcb x2 on cell/mobile

## 2012-04-15 NOTE — Telephone Encounter (Signed)
Patient returning call.

## 2012-04-19 ENCOUNTER — Encounter: Payer: Self-pay | Admitting: *Deleted

## 2012-04-19 ENCOUNTER — Telehealth: Payer: Self-pay | Admitting: Pulmonary Disease

## 2012-04-19 NOTE — Telephone Encounter (Signed)
Spoke with pt He states that he [facility] will not give him o2 until he comes in for ov He is already qualified, but has to have ov here within the next 30 days I called [employer] and spoke with Robynn Pane to verify She states that they received his qualifying sats from 1/27, so he will need to come in within a wk  He does not have to be requalified, but he does have to see a provider here, and TP is okay per Luane School with the pt again and scheduled ov with TP for Friday 04/22/12 at 2:30 pm

## 2012-04-19 NOTE — Telephone Encounter (Signed)
Pt states that he never rec'd a returned phone call from this office & is very upset.  Pt states that it's a good thing that he didn't need a heart replacement.  Pt states that he was told we would be contacting the DME to have O2 delivered to him.  Pt states this hasn't been done & wants to be reached at [phone] ASAP.  Antionette Fairy

## 2012-04-19 NOTE — Telephone Encounter (Signed)
lmomtcb x1 for pt--he does answer the phone when we return call.

## 2012-04-19 NOTE — Telephone Encounter (Signed)
ATC pt line busy x 4 wcb 

## 2012-04-20 NOTE — Telephone Encounter (Signed)
See other phone note 04/19/12

## 2012-04-22 ENCOUNTER — Ambulatory Visit: Payer: Medicare Other | Admitting: Adult Health

## 2012-04-28 ENCOUNTER — Ambulatory Visit: Payer: Medicare Other | Admitting: Adult Health

## 2012-05-02 ENCOUNTER — Encounter: Payer: Self-pay | Admitting: Adult Health

## 2012-05-02 ENCOUNTER — Ambulatory Visit (INDEPENDENT_AMBULATORY_CARE_PROVIDER_SITE_OTHER): Payer: Medicare Other | Admitting: Adult Health

## 2012-05-02 VITALS — BP 106/68 | HR 81 | Temp 98.1°F | Ht 68.0 in | Wt 204.2 lb

## 2012-05-02 MED ORDER — TIOTROPIUM BROMIDE MONOHYDRATE 18 MCG IN CAPS: ORAL_CAPSULE | RESPIRATORY_TRACT | Status: DC

## 2012-05-02 MED ORDER — BUDESONIDE-FORMOTEROL FUMARATE 160-4.5 MCG/ACT IN AERO: INHALATION_SPRAY | RESPIRATORY_TRACT | Status: DC

## 2012-05-02 NOTE — Assessment & Plan Note (Signed)
COPD now with nocturnal desaturation  Cont on O2 At bedtime   follow up Dr. Vassie Loll  In  3 months and As needed

## 2012-05-02 NOTE — Addendum Note (Signed)
Addended by: Boone Master E on: 05/02/2012 05:19 PM   Modules accepted: Orders

## 2012-05-02 NOTE — Progress Notes (Signed)
  Subjective:    Patient ID: Scott Reese, male    DOB: 1927/05/08, 79 y.o.   MRN: [id number]  HPI 84/M for FU of gold C COPD  Admitted 4/13  for COPD flare,  Acute respiratory failure  Former heavy smoker - quit '91 who lives with 13 cats  1. Severe COPD- IgE 12.4, Spirometry 5/13  FEv1 is 1.16L/39% 2. chronic diastolic CHF - EF 55%, CAD - 40-50% LCx by cath 2004  3. Chronic atrial fibrillation, on Coumadin  4. HTN  5. Transient delirium felt secondary to EtOH withdrawal vs steroids   02/26/2012 26m FU He is switching drs from MR to me.  Reports dyspnea gr 3 on walking around the house & climbing stairs.  Symbicort  & spiriva copays are expensive. At the same time, he does not mind spending $150 on his cat's asthma. Xopenex nebs were very expensive but he got some samples from a relative. CXR 4/13 shows cardiomegaly with mild increased central venous congestion. He denies PND< orthopnea or pedal edema. He is compliant with lasix daily & denies wt gain. Completed pulm rehab but did not enroll in maintenance program >>CT chest showed bibasilar scarring /atx . No ILD   05/02/2012 Follow up for COPD  Returns for follow up of COPD ONO showed desats , started on O2. At bedtime   However needs ov w/in 30 days of ONO / O2 start for insurance to pay for Op  Reports doing well.  wearing O2 at bedtime Remains on symbicort and spiriva  No flare in cough or wheezing.  No hemoptysis , chest pain or edema.    Review of Systems  Constitutional:   No  weight loss, night sweats,  Fevers, chills, + fatigue, or  lassitude.  HEENT:   No headaches,  Difficulty swallowing,  Tooth/dental problems, or  Sore throat,                No sneezing, itching, ear ache, nasal congestion, post nasal drip,   CV:  No chest pain,  Orthopnea, PND, swelling in lower extremities, anasarca, dizziness, palpitations, syncope.   GI  No heartburn, indigestion, abdominal pain, nausea, vomiting, diarrhea, change in bowel  habits, loss of appetite, bloody stools.   Resp:   No coughing up of blood.  No change in color of mucus.  No wheezing.  No chest wall deformity  Skin: no rash or lesions.  GU: no dysuria, change in color of urine, no urgency or frequency.  No flank pain, no hematuria   MS:  No joint pain or swelling.  No decreased range of motion.  No back pain.  Psych:  No change in mood or affect. No depression or anxiety.  No memory loss.          Objective:   Physical Exam  Gen. normal affect,  bearded ENT - no lesions, no post nasal drip Neck: No JVD, no thyromegaly, no carotid bruits Lungs: no use of accessory muscles, no dullness to percussion, no rhonchi , diminshed BS in bases  Cardiovascular: Rhythm regular, heart sounds  normal, no murmurs or gallops, no peripheral edema Abdomen: soft and non-tender, no hepatosplenomegaly, BS normal. Musculoskeletal: No deformities, no cyanosis or clubbing Neuro:  alert, non focal      CT chest 05/03/11 >There is bibasilar  scarring and atelectasis. No findings for interstitial lung  disease or emphysema.  Assessment & Plan:

## 2012-05-02 NOTE — Patient Instructions (Addendum)
Continue on current regimen.  Continue on oxygen At bedtime  As directed Follow up Dr. Vassie Loll in 3 months and As needed

## 2012-05-17 ENCOUNTER — Encounter: Payer: Self-pay | Admitting: Cardiology

## 2012-05-17 ENCOUNTER — Ambulatory Visit (INDEPENDENT_AMBULATORY_CARE_PROVIDER_SITE_OTHER): Payer: Medicare Other | Admitting: *Deleted

## 2012-05-17 ENCOUNTER — Ambulatory Visit (INDEPENDENT_AMBULATORY_CARE_PROVIDER_SITE_OTHER): Payer: Medicare Other | Admitting: Cardiology

## 2012-05-17 VITALS — BP 130/80 | HR 72 | Ht 68.0 in | Wt 199.3 lb

## 2012-05-17 DIAGNOSIS — I482 Chronic atrial fibrillation, unspecified: Secondary | ICD-10-CM

## 2012-05-17 DIAGNOSIS — I480 Paroxysmal atrial fibrillation: Secondary | ICD-10-CM

## 2012-05-17 DIAGNOSIS — I4891 Unspecified atrial fibrillation: Secondary | ICD-10-CM

## 2012-05-17 NOTE — Assessment & Plan Note (Signed)
The patient has not been expressing any symptoms of exacerbation of CHF.  His dyspnea has been stable.  He does use oxygen at night when he sleeps.

## 2012-05-17 NOTE — Assessment & Plan Note (Signed)
The patient has established permanent atrial fibrillation.  He is on Coumadin.  He has had some nosebleeds.  His alcohol ingestion may be interacting with his Coumadin.  We are checking an INR today.  The patient has not had any TIA symptoms.

## 2012-05-17 NOTE — Patient Instructions (Addendum)
Will get your coumadin check today  Your physician recommends that you continue on your current medications as directed. Please refer to the Current Medication list given to you today.  Your physician wants you to follow-up in: 4 months with fasting labs (lp/bmet/hfp)  You will receive a reminder letter in the mail two months in advance. If you don't receive a letter, please call our office to schedule the follow-up appointment.

## 2012-05-17 NOTE — Progress Notes (Signed)
Scott Reese Date of Birth:  09-17-1927 [facility] [address] [place], [place]  [address] [phone]         Fax   [phone]  History of Present Illness: This pleasant 79 year old gentleman is seen for a scheduled followup office visit. The patient has a history of known ischemic heart disease. He has stable exertional angina pectoris. He is a former smoker and does have COPD. He has moderate cardiomegaly but no active symptoms of heart failure at this time. His last nuclear stress test in 2011 showed normal perfusion and his ejection fraction was 61%. A previous echocardiogram in 2005 had shown moderate mitral regurgitation. He has been in chronic atrial fib and is on long-term Coumadin. Since last visit the patient has not been experiencing any new cardiac symptoms. He continues to drink moderate amount of alcohol, at least 2 drinks of vodka per day.  Current Outpatient Prescriptions  Medication Sig Dispense Refill  . allopurinol (ZYLOPRIM) 300 MG tablet Take 1 tablet (300 mg total) by mouth daily.  90 tablet  3  . atorvastatin (LIPITOR) 10 MG tablet Take 1 tablet (10 mg total) by mouth daily.  90 tablet  3  . budesonide-formoterol (SYMBICORT) 160-4.5 MCG/ACT inhaler USE 2 PUFFS BY MOUTH 2 TIMES DAILY.  3 Inhaler  1  . furosemide (LASIX) 40 MG tablet Take 1 tablet (40 mg total) by mouth 2 (two) times daily.  180 tablet  3  . guaiFENesin (MUCINEX) 600 MG 12 hr tablet Take 1 tablet (600 mg total) by mouth 2 (two) times daily as needed for congestion.  60 tablet  1  . levalbuterol (XOPENEX) 1.25 MG/3ML nebulizer solution Take 1.25 mg by nebulization every 4 (four) hours as needed for wheezing.      . metoprolol (LOPRESSOR) 100 MG tablet Take 50 mg by mouth daily.      . nitroGLYCERIN (NITROSTAT) 0.4 MG SL tablet Place 1 tablet (0.4 mg total) under the tongue every 5 (five) minutes as needed. For chest pain.  25 tablet  3  . potassium chloride SA (K-DUR,KLOR-CON) 20  MEQ tablet Take 1 tablet (20 mEq total) by mouth daily.  90 tablet  3  . tiotropium (SPIRIVA HANDIHALER) 18 MCG inhalation capsule USE 1 CAPSULE IN HANDIHALER AND INHALE DAILY.  90 capsule  1  . warfarin (COUMADIN) 5 MG tablet Take as directed by Anticoagulation Clinic  35 tablet  0   No current facility-administered medications for this visit.    No Known Allergies  Patient Active Problem List  Diagnosis  . Coronary artery disease  . Dyspnea  . COPD exacerbation  . Paroxysmal atrial fibrillation  . CHF (congestive heart failure)  . Hyperlipidemia  . Dyspepsia  . Chronic atrial fibrillation  . Dyslipidemia  . Dizziness  . Cough  . COPD (chronic obstructive pulmonary disease)  . Alcohol abuse    History  Smoking status  . Former Smoker -- 2.00 packs/day for 40 years  . Types: Cigarettes  . Quit date: 03/09/1989  Smokeless tobacco  . Never Used    History  Alcohol Use  . 1.2 oz/week  . 2 Shots of liquor per week    Comment: 1/5 per week per pt    Family History  Problem Relation Age of Onset  . Diabetes Father   . Stroke Brother   . Cancer Daughter     Review of Systems: Constitutional: no fever chills diaphoresis or fatigue or change in weight.  Head  and neck: no hearing loss, no epistaxis, no photophobia or visual disturbance. Respiratory: No cough, shortness of breath or wheezing. Cardiovascular: No chest pain peripheral edema, palpitations. Gastrointestinal: No abdominal distention, no abdominal pain, no change in bowel habits hematochezia or melena. Genitourinary: No dysuria, no frequency, no urgency, no nocturia. Musculoskeletal:No arthralgias, no back pain, no gait disturbance or myalgias. Neurological: No dizziness, no headaches, no numbness, no seizures, no syncope, no weakness, no tremors. Hematologic: No lymphadenopathy, no easy bruising. Psychiatric: No confusion, no hallucinations, no sleep disturbance.    Physical Exam: Filed Vitals:    05/17/12 1115  BP: 130/80  Pulse: 72   the general appearance reveals an elderly gentleman in no distress.  His breathing is not labored.The head and neck exam reveals pupils equal and reactive.  Extraocular movements are full.  There is no scleral icterus.  The mouth and pharynx are normal.  The neck is supple.  The carotids reveal no bruits.  The jugular venous pressure is normal.  The  thyroid is not enlarged.  There is no lymphadenopathy.  The chest is clear to percussion and auscultation.  There are no rales or rhonchi.  Expansion of the chest is symmetrical.  The precordium is quiet.  The pulse is irregularly irregular .The first heart sound is normal.  The second heart sound is physiologically split.  There is no  gallop rub or click.  There is a soft apical holosystolic murmur of mitral regurgitation which has a variable musical quality during inspiration.There is no abnormal lift or heave.  The abdomen is soft and nontender.  The bowel sounds are normal.  The liver and spleen are not enlarged.  There are no abdominal masses.  There are no abdominal bruits.  Extremities reveal good pedal pulses.  There is no phlebitis or edema.  There is no cyanosis or clubbing.  Strength is normal and symmetrical in all extremities.  There is no lateralizing weakness.  There are no sensory deficits.  The skin is warm and dry.  There is no rash.     Assessment / Plan: Continue same medication.  Recheck in 4 months for office visit fasting lipid panel hepatic function panel and basal metabolic panel.

## 2012-05-17 NOTE — Assessment & Plan Note (Signed)
Patient has a history of hypercholesterolemia.  He is on Lipitor.  He is not having any myalgias.  We will plan to check fasting lab work at his next visit.

## 2012-05-31 ENCOUNTER — Ambulatory Visit (INDEPENDENT_AMBULATORY_CARE_PROVIDER_SITE_OTHER): Payer: Medicare Other

## 2012-05-31 DIAGNOSIS — I480 Paroxysmal atrial fibrillation: Secondary | ICD-10-CM

## 2012-05-31 DIAGNOSIS — I4891 Unspecified atrial fibrillation: Secondary | ICD-10-CM

## 2012-05-31 LAB — POCT INR: INR: 2.3

## 2012-06-27 ENCOUNTER — Encounter: Payer: Self-pay | Admitting: *Deleted

## 2012-06-28 ENCOUNTER — Ambulatory Visit (INDEPENDENT_AMBULATORY_CARE_PROVIDER_SITE_OTHER): Payer: Medicare Other | Admitting: *Deleted

## 2012-06-28 DIAGNOSIS — I480 Paroxysmal atrial fibrillation: Secondary | ICD-10-CM

## 2012-06-28 DIAGNOSIS — I4891 Unspecified atrial fibrillation: Secondary | ICD-10-CM

## 2012-06-28 LAB — POCT INR: INR: 3.3

## 2012-07-12 ENCOUNTER — Ambulatory Visit (INDEPENDENT_AMBULATORY_CARE_PROVIDER_SITE_OTHER): Payer: Medicare Other | Admitting: *Deleted

## 2012-07-12 DIAGNOSIS — I4891 Unspecified atrial fibrillation: Secondary | ICD-10-CM

## 2012-07-12 DIAGNOSIS — I480 Paroxysmal atrial fibrillation: Secondary | ICD-10-CM

## 2012-07-14 ENCOUNTER — Other Ambulatory Visit: Payer: Self-pay | Admitting: *Deleted

## 2012-07-14 MED ORDER — POTASSIUM CHLORIDE CRYS ER 20 MEQ PO TBCR: 20.0000 meq | EXTENDED_RELEASE_TABLET | Freq: Every day | ORAL | Status: DC

## 2012-07-14 MED ORDER — ALLOPURINOL 300 MG PO TABS: 300.0000 mg | ORAL_TABLET | Freq: Every day | ORAL | Status: DC

## 2012-07-15 ENCOUNTER — Other Ambulatory Visit: Payer: Self-pay | Admitting: *Deleted

## 2012-07-15 MED ORDER — ALLOPURINOL 300 MG PO TABS: 300.0000 mg | ORAL_TABLET | Freq: Every day | ORAL | Status: DC

## 2012-07-15 MED ORDER — POTASSIUM CHLORIDE CRYS ER 20 MEQ PO TBCR: 20.0000 meq | EXTENDED_RELEASE_TABLET | Freq: Every day | ORAL | Status: AC

## 2012-08-02 ENCOUNTER — Ambulatory Visit (INDEPENDENT_AMBULATORY_CARE_PROVIDER_SITE_OTHER): Payer: Medicare Other | Admitting: *Deleted

## 2012-08-02 DIAGNOSIS — I480 Paroxysmal atrial fibrillation: Secondary | ICD-10-CM

## 2012-08-02 DIAGNOSIS — I4891 Unspecified atrial fibrillation: Secondary | ICD-10-CM

## 2012-08-08 ENCOUNTER — Encounter: Payer: Self-pay | Admitting: Pulmonary Disease

## 2012-08-08 ENCOUNTER — Ambulatory Visit (INDEPENDENT_AMBULATORY_CARE_PROVIDER_SITE_OTHER): Payer: Medicare Other | Admitting: Pulmonary Disease

## 2012-08-08 VITALS — BP 130/76 | HR 80 | Temp 97.6°F | Ht 68.0 in | Wt 208.4 lb

## 2012-08-08 DIAGNOSIS — J449 Chronic obstructive pulmonary disease, unspecified: Secondary | ICD-10-CM

## 2012-08-08 NOTE — Assessment & Plan Note (Signed)
Aim for wt 195 lbs Lasix twice daily until you lose 5 lbs, the  Once daily

## 2012-08-08 NOTE — Patient Instructions (Addendum)
Aim for wt 195 lbs Lasix twice daily until you lose 5 lbs, the  Once daily  Stay on symbicort & spiriva

## 2012-08-08 NOTE — Progress Notes (Signed)
  Subjective:    Patient ID: Scott Reese, male    DOB: [birth year], 79 y.o.   MRN: [id number]  HPI 84/M for FU of gold C COPD  Admitted 4/13 for COPD flare, Acute respiratory failure  Former heavy smoker - quit '91 who lives with 13 cats  1. Severe COPD- IgE 12.4, Spirometry 5/13 FEv1 is 1.16L/39%  2. chronic diastolic CHF - EF 55%, CAD - 40-50% LCx by cath 2004  3. Chronic atrial fibrillation, on Coumadin  4. HTN  5. Transient delirium felt secondary to EtOH withdrawal vs steroids   02/26/2012 44m FU  He is switching drs from [facility] to me.  Reports dyspnea gr 3 on walking around the house & climbing stairs.  Symbicort & spiriva copays are expensive.  Xopenex nebs were very expensive but he got some samples from a relative.  CXR 4/13 shows cardiomegaly with mild increased central venous congestion.  Completed pulm rehab but did not enroll in maintenance program  >>CT chest showed bibasilar scarring /atx . No ILD   08/08/2012 73m FU   ONO showed desats , started on O2. At bedtime  Reports doing well. wearing O2 at bedtime  Remains on symbicort and spiriva  No flare in cough or wheezing.  No hemoptysis , chest pain .  Pt states he feels tired all the time. Pt reports breathing has unchanged but gets winded easy, very little cough. Denies any wheezing, no chest tx, no chest pain  He denies PND< orthopnea  Has increased pedal edema, just saw Cards, Not compliant with lasix bid - but hands draw up- cramps BUN/cr 28/1.3 Review of Systems neg for any significant sore throat, dysphagia, itching, sneezing, nasal congestion or excess/ purulent secretions, fever, chills, sweats, unintended wt loss, pleuritic or exertional cp, hempoptysis, orthopnea pnd or change in chronic leg swelling. Also denies presyncope, palpitations, heartburn, abdominal pain, nausea, vomiting, diarrhea or change in bowel or urinary habits, dysuria,hematuria, rash, arthralgias, visual complaints, headache, numbness weakness or  ataxia.     Objective:   Physical Exam   Gen. Pleasant, dishevelled,well-nourished, in no distress ENT - no lesions, no post nasal drip Neck: No JVD, no thyromegaly, no carotid bruits Lungs: no use of accessory muscles, no dullness to percussion, clear without rales or rhonchi  Cardiovascular: Rhythm regular, heart sounds  normal, no murmurs or gallops, no peripheral edema Musculoskeletal: No deformities, no cyanosis or clubbing         Assessment & Plan:

## 2012-08-08 NOTE — Assessment & Plan Note (Signed)
Stay on symbicort & spiriva

## 2012-08-30 ENCOUNTER — Ambulatory Visit (INDEPENDENT_AMBULATORY_CARE_PROVIDER_SITE_OTHER): Payer: Medicare Other | Admitting: *Deleted

## 2012-08-30 DIAGNOSIS — I480 Paroxysmal atrial fibrillation: Secondary | ICD-10-CM

## 2012-08-30 DIAGNOSIS — I4891 Unspecified atrial fibrillation: Secondary | ICD-10-CM

## 2012-08-30 LAB — POCT INR: INR: 2.2

## 2012-09-07 ENCOUNTER — Telehealth: Payer: Self-pay | Admitting: Pulmonary Disease

## 2012-09-07 MED ORDER — BUDESONIDE-FORMOTEROL FUMARATE 160-4.5 MCG/ACT IN AERO: INHALATION_SPRAY | RESPIRATORY_TRACT | Status: DC

## 2012-09-07 MED ORDER — TIOTROPIUM BROMIDE MONOHYDRATE 18 MCG IN CAPS: ORAL_CAPSULE | RESPIRATORY_TRACT | Status: DC

## 2012-09-07 NOTE — Telephone Encounter (Signed)
lmtcb x1 on home # Called alternate # NA

## 2012-09-07 NOTE — Telephone Encounter (Signed)
Pt aware RX's sent. Nothing further was needed

## 2012-09-08 NOTE — Telephone Encounter (Signed)
Spoke with patient, this is in regards to msg from 2nd msg on 09/07/12 Rx have already been sent and nothing further needed at this time

## 2012-09-13 ENCOUNTER — Other Ambulatory Visit: Payer: Self-pay | Admitting: Cardiology

## 2012-09-19 ENCOUNTER — Telehealth: Payer: Self-pay | Admitting: Pulmonary Disease

## 2012-09-19 MED ORDER — BUDESONIDE-FORMOTEROL FUMARATE 160-4.5 MCG/ACT IN AERO: INHALATION_SPRAY | RESPIRATORY_TRACT | Status: DC

## 2012-09-19 MED ORDER — TIOTROPIUM BROMIDE MONOHYDRATE 18 MCG IN CAPS: ORAL_CAPSULE | RESPIRATORY_TRACT | Status: DC

## 2012-09-19 NOTE — Telephone Encounter (Signed)
RX's was sent 09/07/12 to [facility]  I called [facility] and was advised they did receive RX's . Pt has high co-pay for both of these medications. The reason they have not shipped out to pt. I called pt and made him aware. He stated they have his card so they can charge him. He stated he will call to advise them to do so since i can't. I advised him will leave samples upfront for pick up in [place] office. He voiced his understanding

## 2012-09-27 ENCOUNTER — Ambulatory Visit (INDEPENDENT_AMBULATORY_CARE_PROVIDER_SITE_OTHER): Payer: Medicare Other | Admitting: Pharmacist

## 2012-09-27 DIAGNOSIS — I480 Paroxysmal atrial fibrillation: Secondary | ICD-10-CM

## 2012-09-27 DIAGNOSIS — I4891 Unspecified atrial fibrillation: Secondary | ICD-10-CM

## 2012-09-27 LAB — POCT INR: INR: 2.3

## 2012-10-25 ENCOUNTER — Ambulatory Visit (INDEPENDENT_AMBULATORY_CARE_PROVIDER_SITE_OTHER): Payer: Medicare Other | Admitting: Pharmacist

## 2012-10-25 DIAGNOSIS — I480 Paroxysmal atrial fibrillation: Secondary | ICD-10-CM

## 2012-10-25 DIAGNOSIS — I4891 Unspecified atrial fibrillation: Secondary | ICD-10-CM

## 2012-10-25 LAB — POCT INR: INR: 1.8

## 2012-11-16 ENCOUNTER — Ambulatory Visit (INDEPENDENT_AMBULATORY_CARE_PROVIDER_SITE_OTHER): Payer: Medicare Other | Admitting: *Deleted

## 2012-11-16 ENCOUNTER — Ambulatory Visit (INDEPENDENT_AMBULATORY_CARE_PROVIDER_SITE_OTHER): Payer: Medicare Other | Admitting: Cardiology

## 2012-11-16 ENCOUNTER — Encounter: Payer: Self-pay | Admitting: Cardiology

## 2012-11-16 VITALS — BP 150/74 | HR 80 | Ht 68.0 in | Wt 198.0 lb

## 2012-11-16 DIAGNOSIS — E785 Hyperlipidemia, unspecified: Secondary | ICD-10-CM

## 2012-11-16 DIAGNOSIS — G629 Polyneuropathy, unspecified: Secondary | ICD-10-CM | POA: Insufficient documentation

## 2012-11-16 DIAGNOSIS — I482 Chronic atrial fibrillation, unspecified: Secondary | ICD-10-CM

## 2012-11-16 DIAGNOSIS — I480 Paroxysmal atrial fibrillation: Secondary | ICD-10-CM

## 2012-11-16 DIAGNOSIS — I4891 Unspecified atrial fibrillation: Secondary | ICD-10-CM

## 2012-11-16 DIAGNOSIS — Z79899 Other long term (current) drug therapy: Secondary | ICD-10-CM

## 2012-11-16 DIAGNOSIS — I2510 Atherosclerotic heart disease of native coronary artery without angina pectoris: Secondary | ICD-10-CM

## 2012-11-16 DIAGNOSIS — G609 Hereditary and idiopathic neuropathy, unspecified: Secondary | ICD-10-CM

## 2012-11-16 MED ORDER — ALLOPURINOL 100 MG PO TABS: 300.0000 mg | ORAL_TABLET | Freq: Every day | ORAL | Status: DC

## 2012-11-16 NOTE — Progress Notes (Signed)
Scott Reese Date of Birth:  01-05-1928 [facility] [address] [place], [place]  [address] [phone]         Fax   [phone]  History of Present Illness: This pleasant 79 year old gentleman is seen for a scheduled followup office visit. The patient has a history of known ischemic heart disease. He has stable exertional angina pectoris. He is a former smoker and does have COPD. He has moderate cardiomegaly but no active symptoms of heart failure at this time. His last nuclear stress test in 2011 showed normal perfusion and his ejection fraction was 61%. A previous echocardiogram in 2005 had shown moderate mitral regurgitation. He has been in chronic atrial fib and is on long-term Coumadin. Since last visit the patient has not been experiencing any new cardiac symptoms.  He continues to drink moderate amount of alcohol, at least 2 drinks of vodka per day.  Recently he has been having symptoms of peripheral neuropathy.  This may be secondary to significant alcohol intake.  He is not known to be diabetic.   Current Outpatient Prescriptions  Medication Sig Dispense Refill  . allopurinol (ZYLOPRIM) 100 MG tablet Take 3 tablets (300 mg total) by mouth daily.  90 tablet  3  . atorvastatin (LIPITOR) 10 MG tablet TAKE 1 TABLET EVERY DAY  90 tablet  3  . b complex vitamins tablet Take 1 tablet by mouth daily.      . budesonide-formoterol (SYMBICORT) 160-4.5 MCG/ACT inhaler USE 2 PUFFS BY MOUTH 2 TIMES DAILY.  2 Inhaler  0  . furosemide (LASIX) 40 MG tablet TAKE 1 TABLET TWICE  DAILY.  180 tablet  3  . levalbuterol (XOPENEX) 1.25 MG/3ML nebulizer solution Take 1.25 mg by nebulization every 4 (four) hours as needed for wheezing.      . metoprolol (LOPRESSOR) 100 MG tablet TAKE 1/2 TABLET TWICE DAILY  90 tablet  3  . nitroGLYCERIN (NITROSTAT) 0.4 MG SL tablet Place 1 tablet (0.4 mg total) under the tongue every 5 (five) minutes as needed. For chest pain.  25 tablet  3  .  potassium chloride SA (K-DUR,KLOR-CON) 20 MEQ tablet Take 1 tablet (20 mEq total) by mouth daily.  90 tablet  1  . tiotropium (SPIRIVA HANDIHALER) 18 MCG inhalation capsule USE 1 CAPSULE IN HANDIHALER AND INHALE DAILY.  20 capsule  0  . warfarin (COUMADIN) 5 MG tablet TAKE AS DIRECTED  BY  ANTICOAGULATION  CLINIC  90 tablet  1   No current facility-administered medications for this visit.    No Known Allergies  Patient Active Problem List   Diagnosis Date Noted  . Chronic atrial fibrillation 07/21/2010    Priority: High  . Coronary artery disease     Priority: Medium  . Dyspnea     Priority: Medium  . Peripheral neuropathy 11/16/2012  . Alcohol abuse 07/15/2011  . COPD (chronic obstructive pulmonary disease) 07/09/2011  . Cough 06/30/2011  . Dyslipidemia 12/23/2010  . Dizziness 12/23/2010  . COPD exacerbation   . Paroxysmal atrial fibrillation   . CHF (congestive heart failure)   . Hyperlipidemia   . Dyspepsia     History  Smoking status  . Former Smoker -- 2.00 packs/day for 40 years  . Types: Cigarettes  . Quit date: 03/09/1989  Smokeless tobacco  . Never Used    History  Alcohol Use  . 1.2 oz/week  . 2 Shots of liquor per week    Comment: 1/5 per week per pt  Family History  Problem Relation Age of Onset  . Diabetes Father   . Stroke Brother   . Cancer Daughter     Review of Systems: Constitutional: no fever chills diaphoresis or fatigue or change in weight.  Head and neck: no hearing loss, no epistaxis, no photophobia or visual disturbance. Respiratory: No cough, shortness of breath or wheezing. Cardiovascular: No chest pain peripheral edema, palpitations. Gastrointestinal: No abdominal distention, no abdominal pain, no change in bowel habits hematochezia or melena. Genitourinary: No dysuria, no frequency, no urgency, no nocturia. Musculoskeletal:No arthralgias, no back pain, no gait disturbance or myalgias. Neurological: No dizziness, no headaches,  no numbness, no seizures, no syncope, no weakness, no tremors. Hematologic: No lymphadenopathy, no easy bruising. Psychiatric: No confusion, no hallucinations, no sleep disturbance.    Physical Exam: Filed Vitals:   11/16/12 1508  BP: 150/74  Pulse: 80   the general appearance reveals a well-developed well-nourished elderly gentleman in no distress.  His face is plethoric.The head and neck exam reveals pupils equal and reactive.  Extraocular movements are full.  There is no scleral icterus.  The mouth and pharynx are normal.  The neck is supple.  The carotids reveal no bruits.  The jugular venous pressure is normal.  The  thyroid is not enlarged.  There is no lymphadenopathy.  The chest is clear to percussion and auscultation.  There are no rales or rhonchi.  Expansion of the chest is symmetrical.  The precordium is quiet.  The pulse is irregularly irregular  The first heart sound is normal.  The second heart sound is physiologically split.  There is no murmur gallop rub or click.  There is no abnormal lift or heave.  The abdomen is soft and nontender.  The bowel sounds are normal.  The liver and spleen are not enlarged.  There are no abdominal masses.  There are no abdominal bruits.  Extremities reveal good pedal pulses.  There is no phlebitis or edema.  There is no cyanosis or clubbing.  Strength is normal and symmetrical in all extremities.  There is no lateralizing weakness.   The skin is warm and dry.  There is no rash.  EKG shows atrial fibrillation with bifascicular block and LVH.   Assessment / Plan: Continue same medication except reduced dose of allopurinol down to 100 mg daily. Reduce alcohol intake significantly. Recheck in 4 months for followup office visit lipid panel hepatic function panel basal metabolic panel uric acid and CBC

## 2012-11-16 NOTE — Patient Instructions (Signed)
START B COMPLEX MULTIVITAMIN DAILY  DECREASE YOUR ALLOPURINOL TO 100 MG DAILY  DECREASE YOUR ALCOHOL INTAKE  Your physician wants you to follow-up in: 4 months with fasting labs (lp/bmet/hfp/uric acid/cbc)  You will receive a reminder letter in the mail two months in advance. If you don't receive a letter, please call our office to schedule the follow-up appointment.

## 2012-11-16 NOTE — Assessment & Plan Note (Signed)
The patient is in chronic established atrial fibrillation.  He is on long-term Coumadin.  He has not been having any TIA symptoms.  He is not having any awareness of GI bleed or other hemorrhagic problems.

## 2012-11-16 NOTE — Assessment & Plan Note (Signed)
The patient has not been experiencing any angina pectoris. 

## 2012-11-16 NOTE — Assessment & Plan Note (Signed)
He has symptoms of peripheral neuropathy with his feet feeling cold non-and tingling.  His wife has diabetic neuropathy so he was aware of these symptoms.  I suspect his neuropathy is probably secondary to heavy alcohol intake.  He has also been on full dose allopurinol which can cause peripheral neuropathy.  We will decrease his allopurinol to just 100 mg daily.  We will add a B complex multivitamin one daily to his regimen.  I have counseled him to cut back significantly on his alcohol intake.

## 2012-12-08 ENCOUNTER — Encounter: Payer: Self-pay | Admitting: Pulmonary Disease

## 2012-12-08 ENCOUNTER — Ambulatory Visit (INDEPENDENT_AMBULATORY_CARE_PROVIDER_SITE_OTHER): Payer: Medicare Other | Admitting: Pulmonary Disease

## 2012-12-08 VITALS — BP 116/72 | HR 73 | Temp 98.0°F | Ht 68.0 in | Wt 199.2 lb

## 2012-12-08 DIAGNOSIS — Z23 Encounter for immunization: Secondary | ICD-10-CM

## 2012-12-08 DIAGNOSIS — J441 Chronic obstructive pulmonary disease with (acute) exacerbation: Secondary | ICD-10-CM

## 2012-12-08 DIAGNOSIS — J449 Chronic obstructive pulmonary disease, unspecified: Secondary | ICD-10-CM

## 2012-12-08 NOTE — Progress Notes (Signed)
  Subjective:    Patient ID: Scott Reese, male    DOB: 05-Aug-1927, 79 y.o.   MRN: [id number]  HPI  85/M for FU of gold C COPD  Admitted 4/13 for COPD flare, Acute respiratory failure  Former heavy smoker - quit '91 who lives with 13 cats  1. Severe COPD- IgE 12.4, Spirometry 5/13 FEv1 is 1.16L/39%  2. chronic diastolic CHF - EF 55%, CAD - 40-50% LCx by cath 2004  3. Chronic atrial fibrillation, on Coumadin  4. HTN  5. Transient delirium felt secondary to EtOH withdrawal vs steroids     Completed pulm rehab but did not enroll in maintenance program  02/26/2012 .>>CT chest showed bibasilar scarring /atx . No ILD  ONO showed desats , started on O2. At bedtime    12/08/2012 48m FU  Pt reports breathing has been okay. Pt has very little cough and at times he gets very little phlem up (not sure what color). Denies any wheezing and no chest tx. He is a little unsteady on his feet but thinks it could be d/t his vision.  Not using o2  Not needed nebs , Remains on symbicort and spiriva  Not compliant with lasix bid - but hands draw up- cramps  Spirometry >> FEV1 1.46 -50%, FVC 1.87 47%, ratio 78 -improved  Review of Systems neg for any significant sore throat, dysphagia, itching, sneezing, nasal congestion or excess/ purulent secretions, fever, chills, sweats, unintended wt loss, pleuritic or exertional cp, hempoptysis, orthopnea pnd or change in chronic leg swelling. Also denies presyncope, palpitations, heartburn, abdominal pain, nausea, vomiting, diarrhea or change in bowel or urinary habits, dysuria,hematuria, rash, arthralgias, visual complaints, headache, numbness weakness or ataxia.     Objective:   Physical Exam  Gen. Pleasant, disshevelled, well-nourished, in no distress ENT - no lesions, no post nasal drip Neck: No JVD, no thyromegaly, no carotid bruits Lungs: no use of accessory muscles, no dullness to percussion, clear without rales or rhonchi  Cardiovascular: Rhythm regular,  heart sounds  normal, no murmurs or gallops, no peripheral edema Musculoskeletal: No deformities, no cyanosis or clubbing        Assessment & Plan:

## 2012-12-08 NOTE — Patient Instructions (Addendum)
Flu shot SPirometry Check oxygen level during sleep to decide need for this Stay on symbicort & spiriva -sample

## 2012-12-14 ENCOUNTER — Ambulatory Visit (INDEPENDENT_AMBULATORY_CARE_PROVIDER_SITE_OTHER): Payer: Medicare Other | Admitting: *Deleted

## 2012-12-14 DIAGNOSIS — I480 Paroxysmal atrial fibrillation: Secondary | ICD-10-CM

## 2012-12-14 DIAGNOSIS — I4891 Unspecified atrial fibrillation: Secondary | ICD-10-CM

## 2012-12-15 NOTE — Assessment & Plan Note (Signed)
Flu shot SPirometry appears improved Check oxygen level during sleep to decide need for this Stay on symbicort & spiriva -sample

## 2013-01-11 ENCOUNTER — Ambulatory Visit (INDEPENDENT_AMBULATORY_CARE_PROVIDER_SITE_OTHER): Payer: Medicare Other | Admitting: *Deleted

## 2013-01-11 DIAGNOSIS — I480 Paroxysmal atrial fibrillation: Secondary | ICD-10-CM

## 2013-01-11 DIAGNOSIS — I4891 Unspecified atrial fibrillation: Secondary | ICD-10-CM

## 2013-02-22 ENCOUNTER — Ambulatory Visit (INDEPENDENT_AMBULATORY_CARE_PROVIDER_SITE_OTHER): Payer: Medicare Other | Admitting: Pharmacist

## 2013-02-22 DIAGNOSIS — I480 Paroxysmal atrial fibrillation: Secondary | ICD-10-CM

## 2013-02-22 DIAGNOSIS — I4891 Unspecified atrial fibrillation: Secondary | ICD-10-CM

## 2013-02-22 LAB — POCT INR: INR: 2

## 2013-03-08 ENCOUNTER — Other Ambulatory Visit: Payer: Self-pay | Admitting: Pulmonary Disease

## 2013-03-22 ENCOUNTER — Other Ambulatory Visit: Payer: Self-pay | Admitting: Cardiology

## 2013-04-06 ENCOUNTER — Encounter: Payer: Self-pay | Admitting: Cardiology

## 2013-04-06 ENCOUNTER — Ambulatory Visit (INDEPENDENT_AMBULATORY_CARE_PROVIDER_SITE_OTHER): Payer: Medicare Other | Admitting: Cardiology

## 2013-04-06 ENCOUNTER — Ambulatory Visit (INDEPENDENT_AMBULATORY_CARE_PROVIDER_SITE_OTHER): Payer: Medicare Other | Admitting: *Deleted

## 2013-04-06 VITALS — BP 115/74 | HR 79 | Ht 68.0 in | Wt 188.0 lb

## 2013-04-06 DIAGNOSIS — I5023 Acute on chronic systolic (congestive) heart failure: Secondary | ICD-10-CM

## 2013-04-06 DIAGNOSIS — I4891 Unspecified atrial fibrillation: Secondary | ICD-10-CM

## 2013-04-06 DIAGNOSIS — E785 Hyperlipidemia, unspecified: Secondary | ICD-10-CM

## 2013-04-06 DIAGNOSIS — I480 Paroxysmal atrial fibrillation: Secondary | ICD-10-CM

## 2013-04-06 DIAGNOSIS — Z5181 Encounter for therapeutic drug level monitoring: Secondary | ICD-10-CM | POA: Insufficient documentation

## 2013-04-06 DIAGNOSIS — I509 Heart failure, unspecified: Secondary | ICD-10-CM

## 2013-04-06 DIAGNOSIS — Z79899 Other long term (current) drug therapy: Secondary | ICD-10-CM

## 2013-04-06 DIAGNOSIS — I482 Chronic atrial fibrillation, unspecified: Secondary | ICD-10-CM

## 2013-04-06 DIAGNOSIS — I2510 Atherosclerotic heart disease of native coronary artery without angina pectoris: Secondary | ICD-10-CM

## 2013-04-06 LAB — POCT INR: INR: 1.6

## 2013-04-06 LAB — BASIC METABOLIC PANEL
BUN: 28 mg/dL — ABNORMAL HIGH (ref 6–23)
CO2: 32 mEq/L (ref 19–32)
Calcium: 9.1 mg/dL (ref 8.4–10.5)
Chloride: 102 mEq/L (ref 96–112)
Creatinine, Ser: 1.2 mg/dL (ref 0.4–1.5)
GFR: 61.39 mL/min (ref >60.00)
GLUCOSE: 91 mg/dL (ref 70–99)
POTASSIUM: 4.3 meq/L (ref 3.5–5.1)
Sodium: 140 mEq/L (ref 135–145)

## 2013-04-06 LAB — HEPATIC FUNCTION PANEL
ALT: 25 U/L (ref 0–53)
AST: 29 U/L (ref 0–37)
Albumin: 3.8 g/dL (ref 3.5–5.2)
Alkaline Phosphatase: 52 U/L (ref 39–117)
BILIRUBIN DIRECT: 0.1 mg/dL (ref 0.0–0.3)
TOTAL PROTEIN: 6.8 g/dL (ref 6.0–8.3)
Total Bilirubin: 0.9 mg/dL (ref 0.3–1.2)

## 2013-04-06 LAB — CBC WITH DIFFERENTIAL/PLATELET
Basophils Absolute: 0 10*3/uL (ref 0.0–0.1)
Basophils Relative: 0.5 % (ref 0.0–3.0)
EOS ABS: 0.1 10*3/uL (ref 0.0–0.7)
EOS PCT: 1.8 % (ref 0.0–5.0)
HEMATOCRIT: 38.8 % — AB (ref 39.0–52.0)
Hemoglobin: 12 g/dL — ABNORMAL LOW (ref 13.0–17.0)
LYMPHS ABS: 1.2 10*3/uL (ref 0.7–4.0)
Lymphocytes Relative: 18.4 % (ref 12.0–46.0)
MCHC: 31 g/dL (ref 30.0–36.0)
MCV: 96.9 fl (ref 78.0–100.0)
Monocytes Absolute: 0.6 10*3/uL (ref 0.1–1.0)
Monocytes Relative: 8.8 % (ref 3.0–12.0)
Neutro Abs: 4.7 10*3/uL (ref 1.4–7.7)
Neutrophils Relative %: 70.5 % (ref 43.0–77.0)
PLATELETS: 161 10*3/uL (ref 150.0–400.0)
RBC: 4 Mil/uL — ABNORMAL LOW (ref 4.22–5.81)
RDW: 16.7 % — ABNORMAL HIGH (ref 11.5–14.6)
WBC: 6.6 10*3/uL (ref 4.5–10.5)

## 2013-04-06 LAB — LIPID PANEL
Cholesterol: 143 mg/dL (ref 0–200)
HDL: 39.4 mg/dL (ref >39.00)
LDL CALC: 69 mg/dL (ref 0–99)
Total CHOL/HDL Ratio: 4
Triglycerides: 174 mg/dL — ABNORMAL HIGH (ref 0.0–149.0)
VLDL: 34.8 mg/dL (ref 0.0–40.0)

## 2013-04-06 LAB — URIC ACID: Uric Acid, Serum: 6 mg/dL (ref 4.0–7.8)

## 2013-04-06 NOTE — Assessment & Plan Note (Signed)
The patient is on chronic Coumadin.  He has not had any TIA symptoms.  He is not having any evidence of GI bleeding

## 2013-04-06 NOTE — Patient Instructions (Signed)
Will obtain labs today and call you with the results (lp/bmet/hfp/cbc/uric acid)  Your physician recommends that you continue on your current medications as directed. Please refer to the Current Medication list given to you today.  Your physician wants you to follow-up in: 4 month ov.ekg You will receive a reminder letter in the mail two months in advance. If you don't receive a letter, please call our office to schedule the follow-up appointment.

## 2013-04-06 NOTE — Assessment & Plan Note (Signed)
The patient denies any recurrent chest pain or angina pectoris.  He has not had to take any sublingual nitroglycerin.

## 2013-04-06 NOTE — Assessment & Plan Note (Signed)
He is not having any orthopnea or paroxysmal nocturnal dyspnea or other symptoms of cardiac decompensation.

## 2013-04-06 NOTE — Progress Notes (Signed)
Scott CraftBobby G Holtrop Date of Birth:  Mar 26, 1927 [address] [place], [place]  [address] [phone]         Fax   [phone]  History of Present Illness: This pleasant 80 year old gentleman is seen for a scheduled followup office visit. The patient has a history of known ischemic heart disease. He has stable exertional angina pectoris. He is a former smoker and does have COPD. He has moderate cardiomegaly but no active symptoms of heart failure at this time. His last nuclear stress test in 2011 showed normal perfusion and his ejection fraction was 61%. A previous echocardiogram in 2005 had shown moderate mitral regurgitation.  His last echocardiogram on 06/27/11 showed an ejection fraction of 55-60%. He has been in chronic atrial fib and is on long-term Coumadin. Since last visit the patient has not been experiencing any new cardiac symptoms.  He continues to drink moderate amount of alcohol, at least 2 drinks of vodka per day.  Recently he has been having symptoms of peripheral neuropathy.  This may be secondary to significant alcohol intake.  He is not known to be diabetic.   Current Outpatient Prescriptions  Medication Sig Dispense Refill  . allopurinol (ZYLOPRIM) 100 MG tablet Take 100 mg by mouth daily.      Marland Kitchen. atorvastatin (LIPITOR) 10 MG tablet TAKE 1 TABLET EVERY DAY  90 tablet  3  . b complex vitamins tablet Take 1 tablet by mouth daily.      . budesonide-formoterol (SYMBICORT) 160-4.5 MCG/ACT inhaler USE 2 PUFFS BY MOUTH 2 TIMES DAILY.  2 Inhaler  0  . furosemide (LASIX) 40 MG tablet TAKE 1 TABLET TWICE  DAILY.  180 tablet  3  . levalbuterol (XOPENEX) 1.25 MG/3ML nebulizer solution Take 1.25 mg by nebulization every 4 (four) hours as needed for wheezing.      . metoprolol (LOPRESSOR) 100 MG tablet TAKE 1/2 TABLET TWICE DAILY  90 tablet  3  . NITROSTAT 0.4 MG SL tablet PLACE 1 TABLET UNDER THE TONGUE EVERY 5 MINUTES ASNEEDED FOR CHEST PAIN  25 tablet  3  . potassium  chloride SA (K-DUR,KLOR-CON) 20 MEQ tablet Take 1 tablet (20 mEq total) by mouth daily.  90 tablet  1  . SPIRIVA HANDIHALER 18 MCG inhalation capsule INHALE THE CONTENTS OF 1 CAPSULE IN HANDIHALER EVERY DAY  90 capsule  1  . warfarin (COUMADIN) 5 MG tablet TAKE AS DIRECTED  BY  ANTICOAGULATION  CLINIC  90 tablet  1   No current facility-administered medications for this visit.    No Known Allergies  Patient Active Problem List   Diagnosis Date Noted  . Chronic atrial fibrillation 07/21/2010    Priority: High  . Coronary artery disease     Priority: Medium  . Encounter for therapeutic drug monitoring 04/06/2013  . Acute on chronic systolic congestive heart failure 04/06/2013  . Peripheral neuropathy 11/16/2012  . Alcohol abuse 07/15/2011  . COPD (chronic obstructive pulmonary disease) 07/09/2011  . Cough 06/30/2011  . Dyslipidemia 12/23/2010  . Dizziness 12/23/2010  . COPD exacerbation   . Hyperlipidemia   . Dyspepsia     History  Smoking status  . Former Smoker -- 2.00 packs/day for 40 years  . Types: Cigarettes  . Quit date: 03/09/1989  Smokeless tobacco  . Never Used    History  Alcohol Use  . 1.2 oz/week  . 2 Shots of liquor per week    Comment: 1/5 per week per pt  Family History  Problem Relation Age of Onset  . Diabetes Father   . Stroke Brother   . Cancer Daughter     Review of Systems: Constitutional: no fever chills diaphoresis or fatigue or change in weight.  Head and neck: no hearing loss, no epistaxis, no photophobia or visual disturbance. Respiratory: No cough, shortness of breath or wheezing. Cardiovascular: No chest pain peripheral edema, palpitations. Gastrointestinal: No abdominal distention, no abdominal pain, no change in bowel habits hematochezia or melena. Genitourinary: No dysuria, no frequency, no urgency, no nocturia. Musculoskeletal:No arthralgias, no back pain, no gait disturbance or myalgias. Neurological: No dizziness, no  headaches, no numbness, no seizures, no syncope, no weakness, no tremors. Hematologic: No lymphadenopathy, no easy bruising. Psychiatric: No confusion, no hallucinations, no sleep disturbance.    Physical Exam: Filed Vitals:   04/06/13 1508  BP: 115/74  Pulse: 79   the general appearance reveals a well-developed well-nourished elderly gentleman in no distress.  His face is plethoric.The head and neck exam reveals pupils equal and reactive.  Extraocular movements are full.  There is no scleral icterus.  The mouth and pharynx are normal.  The neck is supple.  The carotids reveal no bruits.  The jugular venous pressure is normal.  The  thyroid is not enlarged.  There is no lymphadenopathy.  The chest is clear to percussion and auscultation.  There are no rales or rhonchi.  Expansion of the chest is symmetrical.  The precordium is quiet.  The pulse is irregularly irregular  The first heart sound is normal.  The second heart sound is physiologically split.  There is no murmur gallop rub or click.  There is no abnormal lift or heave.  The abdomen is soft and nontender.  The bowel sounds are normal.  The liver and spleen are not enlarged.  There are no abdominal masses.  There are no abdominal bruits.  Extremities reveal good pedal pulses.  There is no phlebitis or edema.  There is no cyanosis or clubbing.  Strength is normal and symmetrical in all extremities.  There is no lateralizing weakness.   The skin is warm and dry.  There is no rash.     Assessment / Plan: Continue same medication.  Recheck in 4 months for office visit and EKG.  Today we are checking fasting lab work including but the panel hepatic function panel basal metabolic panel CBC and uric acid.

## 2013-04-06 NOTE — Progress Notes (Signed)
Quick Note:  Please report to patient. The recent labs are stable. Continue same medication and careful diet. Uric acid okay ______

## 2013-04-10 ENCOUNTER — Telehealth: Payer: Self-pay | Admitting: *Deleted

## 2013-04-10 NOTE — Telephone Encounter (Signed)
Message copied by Burnell BlanksPRATT, Peyson Delao B on Mon Apr 10, 2013  4:19 PM ------      Message from: Cassell ClementBRACKBILL, THOMAS      Created: Thu Apr 06, 2013  8:47 PM       Please report to patient.  The recent labs are stable. Continue same medication and careful diet. Uric acid okay ------

## 2013-04-10 NOTE — Telephone Encounter (Signed)
Tried to call patient twice today, cell number straight to a message and unable to leave one and home number sounded like a fax number. Mailed copy of labs and highlighted  Dr. Yevonne PaxBrackbill's comments

## 2013-06-05 ENCOUNTER — Encounter: Payer: Self-pay | Admitting: Cardiology

## 2013-06-07 ENCOUNTER — Ambulatory Visit: Payer: Medicare Other | Admitting: Adult Health

## 2013-06-08 ENCOUNTER — Ambulatory Visit: Payer: Medicare Other | Admitting: Adult Health

## 2013-06-08 ENCOUNTER — Ambulatory Visit (INDEPENDENT_AMBULATORY_CARE_PROVIDER_SITE_OTHER): Payer: Medicare Other

## 2013-06-08 DIAGNOSIS — Z5181 Encounter for therapeutic drug level monitoring: Secondary | ICD-10-CM

## 2013-06-08 DIAGNOSIS — I4891 Unspecified atrial fibrillation: Secondary | ICD-10-CM

## 2013-06-08 DIAGNOSIS — I480 Paroxysmal atrial fibrillation: Secondary | ICD-10-CM

## 2013-06-08 LAB — POCT INR: INR: 2.2

## 2013-06-13 ENCOUNTER — Ambulatory Visit: Payer: Medicare Other | Admitting: Adult Health

## 2013-06-15 ENCOUNTER — Ambulatory Visit: Payer: Medicare Other | Admitting: Adult Health

## 2013-06-22 ENCOUNTER — Ambulatory Visit: Payer: Medicare Other | Admitting: Adult Health

## 2013-06-29 ENCOUNTER — Encounter: Payer: Self-pay | Admitting: Adult Health

## 2013-06-29 ENCOUNTER — Ambulatory Visit (INDEPENDENT_AMBULATORY_CARE_PROVIDER_SITE_OTHER): Payer: Medicare Other | Admitting: Adult Health

## 2013-06-29 VITALS — BP 126/74 | HR 89 | Temp 98.2°F | Ht 68.0 in | Wt 183.2 lb

## 2013-06-29 DIAGNOSIS — J449 Chronic obstructive pulmonary disease, unspecified: Secondary | ICD-10-CM

## 2013-06-29 DIAGNOSIS — I2510 Atherosclerotic heart disease of native coronary artery without angina pectoris: Secondary | ICD-10-CM

## 2013-06-29 NOTE — Progress Notes (Signed)
  Subjective:    Patient ID: Scott Reese, male    DOB: 04/30/1927, 80 y.o.   MRN: [id number]  HPI 85/M for FU of gold C COPD  Admitted 4/13 for COPD flare, Acute respiratory failure  Former heavy smoker - quit '91 who lives with 13 cats  1. Severe COPD- IgE 12.4, Spirometry 5/13 FEv1 is 1.16L/39%  2. chronic diastolic CHF - EF 55%, CAD - 40-50% LCx by cath 2004  3. Chronic atrial fibrillation, on Coumadin  4. HTN  5. Transient delirium felt secondary to EtOH withdrawal vs steroids     Completed pulm rehab but did not enroll in maintenance program  02/26/2012 .>>CT chest showed bibasilar scarring /atx . No ILD  ONO showed desats , started on O2. At bedtime    12/08/12 5953m FU Pt reports breathing has been okay. Pt has very little cough and at times he gets very little phlem up (not sure what color). Denies any wheezing and no chest tx. He is a little unsteady on his feet but thinks it could be d/t his vision.  Not using o2  Not needed nebs , Remains on symbicort and spiriva  Not compliant with lasix bid - but hands draw up- cramps  Spirometry >> FEV1 1.46 -50%, FVC 1.87 47%, ratio 78 -improved >>no changes , ONO >  06/29/2013 Follow up COPD  Returns for a  6 month follow up COPD.  Breathing is doing well overall, but pollen is bothering him with itchy/watery eyes, prod cough, wheezing, dyspnea, head congestion.  Taking mucinex which helps some.  Not using O2 at bedtime. ONO was not done.  Overall says he is feeling okay. No flare in cough, wheezing or dyspnea.  No chest pain or orthopnea.     Review of Systems  neg for any significant sore throat, dysphagia, itching, sneezing, nasal congestion or excess/ purulent secretions, fever, chills, sweats, unintended wt loss, pleuritic or exertional cp, hempoptysis, orthopnea pnd or change in chronic leg swelling. Also denies presyncope, palpitations, heartburn, abdominal pain, nausea, vomiting, diarrhea or change in bowel or urinary habits,  dysuria,hematuria, rash, arthralgias, visual complaints, headache, numbness weakness or ataxia.     Objective:   Physical Exam   Gen. Pleasant, ,  in no distress ENT - no lesions, no post nasal drip Neck: No JVD, no thyromegaly, no carotid bruits Lungs: no use of accessory muscles, no dullness to percussion, clear without rales or rhonchi  Cardiovascular: Rhythm regular, heart sounds  normal, no murmurs or gallops, no peripheral edema Musculoskeletal: No deformities, no cyanosis or clubbing        Assessment & Plan:

## 2013-06-29 NOTE — Assessment & Plan Note (Signed)
COPD compensated on present regimen  Mild rhinitis  Check ONO   Plan  take Zyrtec 10mg  At bedtime  As needed  Drainage.  Continue on Spiriva and Symbicort.  We will set you up for an Overnight Oxygen check  Follow up Dr. Vassie LollAlva  In 6 months and As needed

## 2013-06-29 NOTE — Patient Instructions (Signed)
Take Zyrtec 10mg  At bedtime  As needed  Drainage.  Continue on Spiriva and Symbicort.  We will set you up for an Overnight Oxygen check  Follow up Dr. Vassie LollAlva  In 6 months and As needed

## 2013-07-18 ENCOUNTER — Ambulatory Visit (INDEPENDENT_AMBULATORY_CARE_PROVIDER_SITE_OTHER): Payer: Medicare Other | Admitting: Cardiology

## 2013-07-18 ENCOUNTER — Encounter: Payer: Self-pay | Admitting: Cardiology

## 2013-07-18 ENCOUNTER — Ambulatory Visit (INDEPENDENT_AMBULATORY_CARE_PROVIDER_SITE_OTHER): Payer: Medicare Other | Admitting: Pharmacist Clinician (PhC)/ Clinical Pharmacy Specialist

## 2013-07-18 VITALS — BP 128/74 | HR 74 | Ht 68.0 in | Wt 169.0 lb

## 2013-07-18 DIAGNOSIS — E785 Hyperlipidemia, unspecified: Secondary | ICD-10-CM

## 2013-07-18 DIAGNOSIS — G609 Hereditary and idiopathic neuropathy, unspecified: Secondary | ICD-10-CM

## 2013-07-18 DIAGNOSIS — I509 Heart failure, unspecified: Secondary | ICD-10-CM

## 2013-07-18 DIAGNOSIS — I480 Paroxysmal atrial fibrillation: Secondary | ICD-10-CM

## 2013-07-18 DIAGNOSIS — I4891 Unspecified atrial fibrillation: Secondary | ICD-10-CM

## 2013-07-18 DIAGNOSIS — G629 Polyneuropathy, unspecified: Secondary | ICD-10-CM

## 2013-07-18 DIAGNOSIS — Z5181 Encounter for therapeutic drug level monitoring: Secondary | ICD-10-CM

## 2013-07-18 DIAGNOSIS — I482 Chronic atrial fibrillation, unspecified: Secondary | ICD-10-CM

## 2013-07-18 DIAGNOSIS — I2510 Atherosclerotic heart disease of native coronary artery without angina pectoris: Secondary | ICD-10-CM

## 2013-07-18 DIAGNOSIS — I5023 Acute on chronic systolic (congestive) heart failure: Secondary | ICD-10-CM

## 2013-07-18 LAB — POCT INR: INR: 1.3

## 2013-07-18 MED ORDER — GABAPENTIN 300 MG PO CAPS: 300.0000 mg | ORAL_CAPSULE | Freq: Every day | ORAL | Status: AC

## 2013-07-18 NOTE — Progress Notes (Signed)
Scott CraftBobby G Reese Date of Birth:  December 02, 1927 [facility] [address] [place], [place]  [address] [phone]        Fax   [phone]   History of Present Illness: This pleasant 80 year old gentleman is seen for a scheduled followup office visit. The patient has a history of known ischemic heart disease. He has stable exertional angina pectoris. He is a former smoker and does have COPD. He has moderate cardiomegaly but no active symptoms of heart failure at this time. His last nuclear stress test in 2011 showed normal perfusion and his ejection fraction was 61%. A previous echocardiogram in 2005 had shown moderate mitral regurgitation. His last echocardiogram on 06/27/11 showed an ejection fraction of 55-60%. He has been in chronic atrial fib and is on long-term Coumadin. Since last visit the patient has not been experiencing any new cardiac symptoms.  He continues to drink moderate amount of alcohol, at least 2 drinks of vodka per day. Recently he has been having symptoms of peripheral neuropathy. This may be secondary to significant alcohol intake. He is not known to be diabetic. The patient states that his wife recently returned home after being in the hospital for about 3 months.  She now has nurses looking after her at home. The patient complains that his neuropathy is getting worse.  Also he has had some falls.  He states that he has cut back on his alcohol intake.   Current Outpatient Prescriptions  Medication Sig Dispense Refill  . allopurinol (ZYLOPRIM) 100 MG tablet Take 100 mg by mouth daily.      Marland Kitchen. atorvastatin (LIPITOR) 10 MG tablet TAKE 1 TABLET EVERY DAY  90 tablet  3  . b complex vitamins tablet Take 1 tablet by mouth daily.      . budesonide-formoterol (SYMBICORT) 160-4.5 MCG/ACT inhaler USE 2 PUFFS BY MOUTH 2 TIMES DAILY.  2 Inhaler  0  . furosemide (LASIX) 40 MG tablet TAKE 1 TABLET TWICE  DAILY.  180 tablet  3  . levalbuterol (XOPENEX) 1.25 MG/3ML  nebulizer solution Take 1.25 mg by nebulization every 4 (four) hours as needed for wheezing.      . metoprolol (LOPRESSOR) 100 MG tablet TAKE 1/2 TABLET TWICE DAILY  90 tablet  3  . NITROSTAT 0.4 MG SL tablet PLACE 1 TABLET UNDER THE TONGUE EVERY 5 MINUTES ASNEEDED FOR CHEST PAIN  25 tablet  3  . SPIRIVA HANDIHALER 18 MCG inhalation capsule INHALE THE CONTENTS OF 1 CAPSULE IN HANDIHALER EVERY DAY  90 capsule  1  . warfarin (COUMADIN) 5 MG tablet TAKE AS DIRECTED  BY  ANTICOAGULATION  CLINIC  90 tablet  1  . gabapentin (NEURONTIN) 300 MG capsule Take 1 capsule (300 mg total) by mouth at bedtime.  30 capsule  5   No current facility-administered medications for this visit.    No Known Allergies  Patient Active Problem List   Diagnosis Date Noted  . Chronic atrial fibrillation 07/21/2010    Priority: High  . Coronary artery disease     Priority: Medium  . Encounter for therapeutic drug monitoring 04/06/2013  . Acute on chronic systolic congestive heart failure 04/06/2013  . Peripheral neuropathy 11/16/2012  . Alcohol abuse 07/15/2011  . COPD (chronic obstructive pulmonary disease) 07/09/2011  . Cough 06/30/2011  . Dyslipidemia 12/23/2010  . Dizziness 12/23/2010  . COPD exacerbation   . Hyperlipidemia   . Dyspepsia     History  Smoking status  .  Former Smoker -- 2.00 packs/day for 40 years  . Types: Cigarettes  . Quit date: 03/09/1989  Smokeless tobacco  . Never Used    History  Alcohol Use  . 1.2 oz/week  . 2 Shots of liquor per week    Comment: 1/5 per week per pt    Family History  Problem Relation Age of Onset  . Diabetes Father   . Stroke Brother   . Cancer Daughter     Review of Systems: Constitutional: no fever chills diaphoresis or fatigue or change in weight.  Head and neck: no hearing loss, no epistaxis, no photophobia or visual disturbance. Respiratory: No cough, shortness of breath or wheezing. Cardiovascular: No chest pain peripheral edema,  palpitations. Gastrointestinal: No abdominal distention, no abdominal pain, no change in bowel habits hematochezia or melena. Genitourinary: No dysuria, no frequency, no urgency, no nocturia. Musculoskeletal:No arthralgias, no back pain, no gait disturbance or myalgias. Neurological: No dizziness, no headaches, no numbness, no seizures, no syncope, no weakness, no tremors. Hematologic: No lymphadenopathy, no easy bruising. Psychiatric: No confusion, no hallucinations, no sleep disturbance.    Physical Exam: Filed Vitals:   07/18/13 0954  BP: 128/74  Pulse: 74   the general appearance reveals an elderly gentleman in no acute distress.The head and neck exam reveals pupils equal and reactive.  Extraocular movements are full.  There is no scleral icterus.  The mouth and pharynx are normal.  The neck is supple.  The carotids reveal no bruits.  The jugular venous pressure is normal.  The  thyroid is not enlarged.  There is no lymphadenopathy.  The chest is clear to percussion and auscultation.  There are no rales or rhonchi.  Expansion of the chest is symmetrical.  The precordium is quiet.  The pulse is irregularly irregular. The first heart sound is normal.  The second heart sound is physiologically split.  There is no  gallop rub or click.  There is a soft apical systolic murmur of mitral regurgitation. There is no abnormal lift or heave.  The abdomen is soft and nontender.  The bowel sounds are normal.  The liver and spleen are not enlarged.  There are no abdominal masses.  There are no abdominal bruits.  Extremities reveal good pedal pulses.  There is no phlebitis or edema.  There is no cyanosis or clubbing.  Strength is normal and symmetrical in all extremities.  There is no lateralizing weakness.  There are no sensory deficits.  The skin is warm and dry.  There is no rash.     Assessment / Plan: 1.  Chronic diastolic congestive heart failure. 2. mitral regurgitation 3. COPD 4. permanent  atrial fibrillation 5. peripheral neuropathy, possibly secondary to alcohol 6. ischemic heart disease   Plan: Continue same medication.  We are checking his INR today.  We will add gabapentin for his peripheral neuropathy pain in his feet.  He was advised to cut way back on alcohol. Recheck in 4 months for office visit lipid panel hepatic function panel and basal metabolic panel

## 2013-07-18 NOTE — Assessment & Plan Note (Signed)
The patient is on warfarin for his chronic atrial fibrillation.  He has not been having any thromboembolic episodes.

## 2013-07-18 NOTE — Patient Instructions (Signed)
START NEURONTIN 300 MG AT BEDTIME, RX SENT TO [facility]   Your physician recommends that you schedule a follow-up appointment in: 4 months with fasting labs (lp/bmet/hfp)

## 2013-07-18 NOTE — Assessment & Plan Note (Signed)
The patient has not had any recurrent chest pain or angina. 

## 2013-07-18 NOTE — Assessment & Plan Note (Signed)
The patient complains that his peripheral neuropathy is getting worse.  He is requesting something for it.  We will give him a trial of gabapentin 300 mg at bedtime

## 2013-09-27 ENCOUNTER — Encounter: Payer: Self-pay | Admitting: Internal Medicine

## 2013-09-30 ENCOUNTER — Inpatient Hospital Stay (HOSPITAL_COMMUNITY)
Admission: EM | Admit: 2013-09-30 | Discharge: 2013-10-07 | DRG: 208 | Disposition: E | Payer: Medicare Other | Attending: Emergency Medicine | Admitting: Emergency Medicine

## 2013-09-30 ENCOUNTER — Inpatient Hospital Stay (HOSPITAL_COMMUNITY): Payer: Medicare Other

## 2013-09-30 ENCOUNTER — Emergency Department (HOSPITAL_COMMUNITY): Payer: Medicare Other

## 2013-09-30 ENCOUNTER — Encounter (HOSPITAL_COMMUNITY): Payer: Self-pay | Admitting: Emergency Medicine

## 2013-09-30 ENCOUNTER — Other Ambulatory Visit: Payer: Self-pay

## 2013-09-30 DIAGNOSIS — G609 Hereditary and idiopathic neuropathy, unspecified: Secondary | ICD-10-CM | POA: Diagnosis present

## 2013-09-30 DIAGNOSIS — N179 Acute kidney failure, unspecified: Secondary | ICD-10-CM | POA: Diagnosis not present

## 2013-09-30 DIAGNOSIS — K560 Paralytic ileus: Secondary | ICD-10-CM

## 2013-09-30 DIAGNOSIS — R142 Eructation: Secondary | ICD-10-CM

## 2013-09-30 DIAGNOSIS — I129 Hypertensive chronic kidney disease with stage 1 through stage 4 chronic kidney disease, or unspecified chronic kidney disease: Secondary | ICD-10-CM | POA: Diagnosis present

## 2013-09-30 DIAGNOSIS — I509 Heart failure, unspecified: Secondary | ICD-10-CM | POA: Diagnosis present

## 2013-09-30 DIAGNOSIS — K219 Gastro-esophageal reflux disease without esophagitis: Secondary | ICD-10-CM | POA: Diagnosis present

## 2013-09-30 DIAGNOSIS — R188 Other ascites: Secondary | ICD-10-CM | POA: Diagnosis present

## 2013-09-30 DIAGNOSIS — Z87891 Personal history of nicotine dependence: Secondary | ICD-10-CM

## 2013-09-30 DIAGNOSIS — J441 Chronic obstructive pulmonary disease with (acute) exacerbation: Secondary | ICD-10-CM | POA: Diagnosis present

## 2013-09-30 DIAGNOSIS — Z79899 Other long term (current) drug therapy: Secondary | ICD-10-CM | POA: Diagnosis not present

## 2013-09-30 DIAGNOSIS — E785 Hyperlipidemia, unspecified: Secondary | ICD-10-CM | POA: Diagnosis present

## 2013-09-30 DIAGNOSIS — I5023 Acute on chronic systolic (congestive) heart failure: Secondary | ICD-10-CM

## 2013-09-30 DIAGNOSIS — I452 Bifascicular block: Secondary | ICD-10-CM | POA: Diagnosis present

## 2013-09-30 DIAGNOSIS — F1020 Alcohol dependence, uncomplicated: Secondary | ICD-10-CM | POA: Diagnosis present

## 2013-09-30 DIAGNOSIS — J690 Pneumonitis due to inhalation of food and vomit: Principal | ICD-10-CM | POA: Diagnosis present

## 2013-09-30 DIAGNOSIS — R0609 Other forms of dyspnea: Secondary | ICD-10-CM

## 2013-09-30 DIAGNOSIS — I059 Rheumatic mitral valve disease, unspecified: Secondary | ICD-10-CM | POA: Diagnosis present

## 2013-09-30 DIAGNOSIS — R0989 Other specified symptoms and signs involving the circulatory and respiratory systems: Secondary | ICD-10-CM

## 2013-09-30 DIAGNOSIS — E875 Hyperkalemia: Secondary | ICD-10-CM | POA: Diagnosis present

## 2013-09-30 DIAGNOSIS — Z823 Family history of stroke: Secondary | ICD-10-CM | POA: Diagnosis not present

## 2013-09-30 DIAGNOSIS — E872 Acidosis, unspecified: Secondary | ICD-10-CM | POA: Diagnosis present

## 2013-09-30 DIAGNOSIS — Z9119 Patient's noncompliance with other medical treatment and regimen: Secondary | ICD-10-CM

## 2013-09-30 DIAGNOSIS — I498 Other specified cardiac arrhythmias: Secondary | ICD-10-CM | POA: Diagnosis not present

## 2013-09-30 DIAGNOSIS — R109 Unspecified abdominal pain: Secondary | ICD-10-CM | POA: Diagnosis present

## 2013-09-30 DIAGNOSIS — R339 Retention of urine, unspecified: Secondary | ICD-10-CM | POA: Diagnosis present

## 2013-09-30 DIAGNOSIS — R4182 Altered mental status, unspecified: Secondary | ICD-10-CM | POA: Diagnosis present

## 2013-09-30 DIAGNOSIS — I469 Cardiac arrest, cause unspecified: Secondary | ICD-10-CM | POA: Diagnosis not present

## 2013-09-30 DIAGNOSIS — R141 Gas pain: Secondary | ICD-10-CM

## 2013-09-30 DIAGNOSIS — E878 Other disorders of electrolyte and fluid balance, not elsewhere classified: Secondary | ICD-10-CM | POA: Diagnosis present

## 2013-09-30 DIAGNOSIS — K7030 Alcoholic cirrhosis of liver without ascites: Secondary | ICD-10-CM

## 2013-09-30 DIAGNOSIS — R7989 Other specified abnormal findings of blood chemistry: Secondary | ICD-10-CM | POA: Diagnosis present

## 2013-09-30 DIAGNOSIS — Z833 Family history of diabetes mellitus: Secondary | ICD-10-CM

## 2013-09-30 DIAGNOSIS — R112 Nausea with vomiting, unspecified: Secondary | ICD-10-CM | POA: Diagnosis not present

## 2013-09-30 DIAGNOSIS — Z515 Encounter for palliative care: Secondary | ICD-10-CM

## 2013-09-30 DIAGNOSIS — R34 Anuria and oliguria: Secondary | ICD-10-CM | POA: Diagnosis present

## 2013-09-30 DIAGNOSIS — J9600 Acute respiratory failure, unspecified whether with hypoxia or hypercapnia: Secondary | ICD-10-CM | POA: Diagnosis present

## 2013-09-30 DIAGNOSIS — Z66 Do not resuscitate: Secondary | ICD-10-CM | POA: Diagnosis not present

## 2013-09-30 DIAGNOSIS — I482 Chronic atrial fibrillation, unspecified: Secondary | ICD-10-CM | POA: Diagnosis present

## 2013-09-30 DIAGNOSIS — N419 Inflammatory disease of prostate, unspecified: Secondary | ICD-10-CM | POA: Diagnosis present

## 2013-09-30 DIAGNOSIS — R7309 Other abnormal glucose: Secondary | ICD-10-CM | POA: Diagnosis present

## 2013-09-30 DIAGNOSIS — K7460 Unspecified cirrhosis of liver: Secondary | ICD-10-CM | POA: Diagnosis present

## 2013-09-30 DIAGNOSIS — N189 Chronic kidney disease, unspecified: Secondary | ICD-10-CM | POA: Diagnosis present

## 2013-09-30 DIAGNOSIS — K769 Liver disease, unspecified: Secondary | ICD-10-CM | POA: Diagnosis present

## 2013-09-30 DIAGNOSIS — R143 Flatulence: Secondary | ICD-10-CM

## 2013-09-30 DIAGNOSIS — R0902 Hypoxemia: Secondary | ICD-10-CM

## 2013-09-30 DIAGNOSIS — R0600 Dyspnea, unspecified: Secondary | ICD-10-CM

## 2013-09-30 DIAGNOSIS — Z91199 Patient's noncompliance with other medical treatment and regimen due to unspecified reason: Secondary | ICD-10-CM

## 2013-09-30 DIAGNOSIS — I5032 Chronic diastolic (congestive) heart failure: Secondary | ICD-10-CM

## 2013-09-30 DIAGNOSIS — R0689 Other abnormalities of breathing: Secondary | ICD-10-CM

## 2013-09-30 DIAGNOSIS — R627 Adult failure to thrive: Secondary | ICD-10-CM | POA: Diagnosis present

## 2013-09-30 DIAGNOSIS — I4891 Unspecified atrial fibrillation: Secondary | ICD-10-CM

## 2013-09-30 DIAGNOSIS — J449 Chronic obstructive pulmonary disease, unspecified: Secondary | ICD-10-CM

## 2013-09-30 DIAGNOSIS — Z7901 Long term (current) use of anticoagulants: Secondary | ICD-10-CM | POA: Diagnosis not present

## 2013-09-30 DIAGNOSIS — I2510 Atherosclerotic heart disease of native coronary artery without angina pectoris: Secondary | ICD-10-CM | POA: Diagnosis present

## 2013-09-30 DIAGNOSIS — J4489 Other specified chronic obstructive pulmonary disease: Secondary | ICD-10-CM

## 2013-09-30 DIAGNOSIS — J9601 Acute respiratory failure with hypoxia: Secondary | ICD-10-CM | POA: Diagnosis present

## 2013-09-30 DIAGNOSIS — I959 Hypotension, unspecified: Secondary | ICD-10-CM | POA: Diagnosis present

## 2013-09-30 DIAGNOSIS — K7031 Alcoholic cirrhosis of liver with ascites: Secondary | ICD-10-CM

## 2013-09-30 LAB — CBC WITH DIFFERENTIAL/PLATELET
BASOS PCT: 0 % (ref 0–1)
Basophils Absolute: 0 10*3/uL (ref 0.0–0.1)
Eosinophils Absolute: 0 10*3/uL (ref 0.0–0.7)
Eosinophils Relative: 0 % (ref 0–5)
HEMATOCRIT: 40.6 % (ref 39.0–52.0)
Hemoglobin: 12.7 g/dL — ABNORMAL LOW (ref 13.0–17.0)
LYMPHS PCT: 6 % — AB (ref 12–46)
Lymphs Abs: 0.6 10*3/uL — ABNORMAL LOW (ref 0.7–4.0)
MCH: 24.3 pg — ABNORMAL LOW (ref 26.0–34.0)
MCHC: 31.3 g/dL (ref 30.0–36.0)
MCV: 77.8 fL — ABNORMAL LOW (ref 78.0–100.0)
MONOS PCT: 8 % (ref 3–12)
Monocytes Absolute: 0.9 10*3/uL (ref 0.1–1.0)
Neutro Abs: 9.5 10*3/uL — ABNORMAL HIGH (ref 1.7–7.7)
Neutrophils Relative %: 86 % — ABNORMAL HIGH (ref 43–77)
Platelets: 367 10*3/uL (ref 150–400)
RBC: 5.22 MIL/uL (ref 4.22–5.81)
RDW: 16.5 % — ABNORMAL HIGH (ref 11.5–15.5)
WBC: 11 10*3/uL — ABNORMAL HIGH (ref 4.0–10.5)

## 2013-09-30 LAB — COMPREHENSIVE METABOLIC PANEL
ALT: 23 U/L (ref 0–53)
ANION GAP: 18 — AB (ref 5–15)
AST: 36 U/L (ref 0–37)
Albumin: 2.5 g/dL — ABNORMAL LOW (ref 3.5–5.2)
Alkaline Phosphatase: 87 U/L (ref 39–117)
BILIRUBIN TOTAL: 0.2 mg/dL — AB (ref 0.3–1.2)
BUN: 101 mg/dL — AB (ref 6–23)
CHLORIDE: 93 meq/L — AB (ref 96–112)
CO2: 26 meq/L (ref 19–32)
CREATININE: 4.31 mg/dL — AB (ref 0.50–1.35)
Calcium: 9.5 mg/dL (ref 8.4–10.5)
GFR calc Af Amer: 13 mL/min — ABNORMAL LOW (ref >90)
GFR calc non Af Amer: 12 mL/min — ABNORMAL LOW (ref >90)
Glucose, Bld: 111 mg/dL — ABNORMAL HIGH (ref 70–99)
Potassium: 5.8 mEq/L — ABNORMAL HIGH (ref 3.7–5.3)
Sodium: 137 mEq/L (ref 137–147)
Total Protein: 7.3 g/dL (ref 6.0–8.3)

## 2013-09-30 LAB — URINALYSIS, ROUTINE W REFLEX MICROSCOPIC
Bilirubin Urine: NEGATIVE
Glucose, UA: NEGATIVE mg/dL
KETONES UR: NEGATIVE mg/dL
Leukocytes, UA: NEGATIVE
Nitrite: NEGATIVE
Protein, ur: NEGATIVE mg/dL
Specific Gravity, Urine: 1.018 (ref 1.005–1.030)
Urobilinogen, UA: 0.2 mg/dL (ref 0.0–1.0)
pH: 5 (ref 5.0–8.0)

## 2013-09-30 LAB — PROTIME-INR
INR: 2.16 — AB (ref 0.00–1.49)
Prothrombin Time: 24.1 seconds — ABNORMAL HIGH (ref 11.6–15.2)

## 2013-09-30 LAB — HEPATIC FUNCTION PANEL
ALK PHOS: 86 U/L (ref 39–117)
ALT: 24 U/L (ref 0–53)
AST: 40 U/L — AB (ref 0–37)
Albumin: 2.2 g/dL — ABNORMAL LOW (ref 3.5–5.2)
Total Bilirubin: 0.3 mg/dL (ref 0.3–1.2)
Total Protein: 6.8 g/dL (ref 6.0–8.3)

## 2013-09-30 LAB — BLOOD GAS, VENOUS
Acid-base deficit: 0 mmol/L (ref 0.0–2.0)
BICARBONATE: 26.2 meq/L — AB (ref 20.0–24.0)
O2 Content: 3 L/min
O2 Saturation: 73.3 %
PCO2 VEN: 51.7 mmHg — AB (ref 45.0–50.0)
PO2 VEN: 46.1 mmHg — AB (ref 30.0–45.0)
Patient temperature: 98.6
TCO2: 23.7 mmol/L (ref 0–100)
pH, Ven: 7.326 — ABNORMAL HIGH (ref 7.250–7.300)

## 2013-09-30 LAB — GLUCOSE, CAPILLARY
GLUCOSE-CAPILLARY: 93 mg/dL (ref 70–99)
GLUCOSE-CAPILLARY: 136 mg/dL — AB (ref 70–99)
Glucose-Capillary: 111 mg/dL — ABNORMAL HIGH (ref 70–99)
Glucose-Capillary: 151 mg/dL — ABNORMAL HIGH (ref 70–99)

## 2013-09-30 LAB — I-STAT CG4 LACTIC ACID, ED: Lactic Acid, Venous: 1.65 mmol/L (ref 0.5–2.2)

## 2013-09-30 LAB — BASIC METABOLIC PANEL
ANION GAP: 20 — AB (ref 5–15)
Anion gap: 16 — ABNORMAL HIGH (ref 5–15)
BUN: 101 mg/dL — ABNORMAL HIGH (ref 6–23)
BUN: 103 mg/dL — ABNORMAL HIGH (ref 6–23)
CALCIUM: 9.3 mg/dL (ref 8.4–10.5)
CHLORIDE: 95 meq/L — AB (ref 96–112)
CO2: 27 mEq/L (ref 19–32)
CO2: 23 mEq/L (ref 19–32)
CREATININE: 4.59 mg/dL — AB (ref 0.50–1.35)
Calcium: 9.2 mg/dL (ref 8.4–10.5)
Chloride: 95 mEq/L — ABNORMAL LOW (ref 96–112)
Creatinine, Ser: 4.29 mg/dL — ABNORMAL HIGH (ref 0.50–1.35)
GFR calc Af Amer: 13 mL/min — ABNORMAL LOW (ref >90)
GFR calc Af Amer: 12 mL/min — ABNORMAL LOW (ref >90)
GFR calc non Af Amer: 12 mL/min — ABNORMAL LOW (ref >90)
GFR, EST NON AFRICAN AMERICAN: 11 mL/min — AB (ref >90)
GLUCOSE: 89 mg/dL (ref 70–99)
Glucose, Bld: 153 mg/dL — ABNORMAL HIGH (ref 70–99)
POTASSIUM: 5.8 meq/L — AB (ref 3.7–5.3)
Potassium: 6.2 mEq/L — ABNORMAL HIGH (ref 3.7–5.3)
SODIUM: 138 meq/L (ref 137–147)
SODIUM: 138 meq/L (ref 137–147)

## 2013-09-30 LAB — CBC
HCT: 39.2 % (ref 39.0–52.0)
Hemoglobin: 11.8 g/dL — ABNORMAL LOW (ref 13.0–17.0)
MCH: 24 pg — ABNORMAL LOW (ref 26.0–34.0)
MCHC: 30.1 g/dL (ref 30.0–36.0)
MCV: 79.8 fL (ref 78.0–100.0)
PLATELETS: 375 10*3/uL (ref 150–400)
RBC: 4.91 MIL/uL (ref 4.22–5.81)
RDW: 16.6 % — ABNORMAL HIGH (ref 11.5–15.5)
WBC: 15.1 10*3/uL — AB (ref 4.0–10.5)

## 2013-09-30 LAB — BLOOD GAS, ARTERIAL
Acid-base deficit: 5.7 mmol/L — ABNORMAL HIGH (ref 0.0–2.0)
Bicarbonate: 20 mEq/L (ref 20.0–24.0)
DRAWN BY: 103701
FIO2: 1 %
LHR: 14 {breaths}/min
MECHVT: 600 mL
O2 SAT: 99.1 %
PEEP: 5 cmH2O
PH ART: 7.294 — AB (ref 7.350–7.450)
PO2 ART: 189 mmHg — AB (ref 80.0–100.0)
Patient temperature: 98.6
TCO2: 18.6 mmol/L (ref 0–100)
pCO2 arterial: 42.5 mmHg (ref 35.0–45.0)

## 2013-09-30 LAB — PRO B NATRIURETIC PEPTIDE: Pro B Natriuretic peptide (BNP): 18240 pg/mL — ABNORMAL HIGH (ref 0–450)

## 2013-09-30 LAB — URINE MICROSCOPIC-ADD ON

## 2013-09-30 LAB — MAGNESIUM: Magnesium: 3.5 mg/dL — ABNORMAL HIGH (ref 1.5–2.5)

## 2013-09-30 LAB — LACTIC ACID, PLASMA: Lactic Acid, Venous: 4.6 mmol/L — ABNORMAL HIGH (ref 0.5–2.2)

## 2013-09-30 LAB — MRSA PCR SCREENING: MRSA BY PCR: NEGATIVE

## 2013-09-30 LAB — POTASSIUM: POTASSIUM: 5.7 meq/L — AB (ref 3.7–5.3)

## 2013-09-30 LAB — TROPONIN I
Troponin I: <0.3 ng/mL (ref <0.30)
Troponin I: <0.3 ng/mL (ref <0.30)

## 2013-09-30 LAB — APTT: APTT: 45 s — AB (ref 24–37)

## 2013-09-30 MED ORDER — SODIUM CHLORIDE 0.9 % IV SOLN
0.0000 ug/h | INTRAVENOUS | Status: DC
  Administered 2013-09-30: 50 ug/h via INTRAVENOUS
  Administered 2013-10-01 (×2): 125 ug/h via INTRAVENOUS
  Filled 2013-09-30 (×3): qty 50

## 2013-09-30 MED ORDER — VITAMIN K1 10 MG/ML IJ SOLN
5.0000 mg | Freq: Once | INTRAMUSCULAR | Status: AC
  Administered 2013-09-30: 5 mg via INTRAVENOUS
  Filled 2013-09-30: qty 0.5

## 2013-09-30 MED ORDER — SODIUM POLYSTYRENE SULFONATE 15 GM/60ML PO SUSP
15.0000 g | Freq: Once | ORAL | Status: AC
  Administered 2013-09-30: 15 g via ORAL
  Filled 2013-09-30: qty 60

## 2013-09-30 MED ORDER — SODIUM CHLORIDE 0.9 % IV SOLN
1.0000 g | Freq: Once | INTRAVENOUS | Status: AC
  Administered 2013-09-30: 1 g via INTRAVENOUS
  Filled 2013-09-30: qty 10

## 2013-09-30 MED ORDER — ONDANSETRON HCL 4 MG/2ML IJ SOLN: 4.0000 mg | Freq: Four times a day (QID) | INTRAMUSCULAR | Status: DC | PRN

## 2013-09-30 MED ORDER — VANCOMYCIN HCL IN DEXTROSE 1-5 GM/200ML-% IV SOLN
1000.0000 mg | INTRAVENOUS | Status: DC
  Administered 2013-10-02: 1000 mg via INTRAVENOUS
  Filled 2013-09-30: qty 200

## 2013-09-30 MED ORDER — SODIUM CHLORIDE 0.9 % IV BOLUS (SEPSIS)
1000.0000 mL | Freq: Once | INTRAVENOUS | Status: AC
  Administered 2013-09-30: 1000 mL via INTRAVENOUS

## 2013-09-30 MED ORDER — DEXTROSE 50 % IV SOLN
50.0000 mL | Freq: Once | INTRAVENOUS | Status: AC
  Administered 2013-09-30: 50 mL via INTRAVENOUS
  Filled 2013-09-30: qty 50

## 2013-09-30 MED ORDER — METHYLPREDNISOLONE SODIUM SUCC 125 MG IJ SOLR
60.0000 mg | Freq: Four times a day (QID) | INTRAMUSCULAR | Status: DC
  Administered 2013-09-30 – 2013-10-02 (×9): 60 mg via INTRAVENOUS
  Filled 2013-09-30 (×9): qty 2

## 2013-09-30 MED ORDER — ONDANSETRON HCL 4 MG/2ML IJ SOLN
4.0000 mg | INTRAMUSCULAR | Status: AC
  Administered 2013-09-30: 4 mg via INTRAVENOUS
  Filled 2013-09-30: qty 2

## 2013-09-30 MED ORDER — FENTANYL CITRATE 0.05 MG/ML IJ SOLN
50.0000 ug | Freq: Once | INTRAMUSCULAR | Status: DC
  Filled 2013-09-30: qty 2

## 2013-09-30 MED ORDER — DEXTROSE 5 % IV SOLN
5.0000 mg/h | Freq: Once | INTRAVENOUS | Status: AC
  Administered 2013-09-30: 10 mg/h via INTRAVENOUS
  Filled 2013-09-30: qty 100

## 2013-09-30 MED ORDER — MORPHINE SULFATE 2 MG/ML IJ SOLN
1.0000 mg | INTRAMUSCULAR | Status: DC | PRN
  Administered 2013-09-30: 1 mg via INTRAVENOUS
  Filled 2013-09-30: qty 1

## 2013-09-30 MED ORDER — METOPROLOL TARTRATE 25 MG PO TABS: 50.0000 mg | ORAL_TABLET | Freq: Two times a day (BID) | ORAL | Status: DC

## 2013-09-30 MED ORDER — FUROSEMIDE 10 MG/ML IJ SOLN
40.0000 mg | Freq: Once | INTRAMUSCULAR | Status: AC
  Administered 2013-09-30: 40 mg via INTRAVENOUS
  Filled 2013-09-30: qty 4

## 2013-09-30 MED ORDER — TIOTROPIUM BROMIDE MONOHYDRATE 18 MCG IN CAPS: 18.0000 ug | ORAL_CAPSULE | Freq: Every day | RESPIRATORY_TRACT | Status: DC

## 2013-09-30 MED ORDER — INSULIN ASPART 100 UNIT/ML ~~LOC~~ SOLN
0.0000 [IU] | Freq: Three times a day (TID) | SUBCUTANEOUS | Status: DC
  Administered 2013-09-30 – 2013-10-01 (×2): 2 [IU] via SUBCUTANEOUS
  Administered 2013-10-02: 1 [IU] via SUBCUTANEOUS

## 2013-09-30 MED ORDER — SODIUM CHLORIDE 0.9 % IJ SOLN
3.0000 mL | Freq: Two times a day (BID) | INTRAMUSCULAR | Status: DC
  Administered 2013-09-30 – 2013-10-02 (×2): 3 mL via INTRAVENOUS

## 2013-09-30 MED ORDER — OXYCODONE HCL 5 MG PO TABS: 5.0000 mg | ORAL_TABLET | ORAL | Status: DC | PRN

## 2013-09-30 MED ORDER — FAMOTIDINE IN NACL 20-0.9 MG/50ML-% IV SOLN
20.0000 mg | Freq: Every day | INTRAVENOUS | Status: DC
  Administered 2013-09-30 – 2013-10-01 (×2): 20 mg via INTRAVENOUS
  Filled 2013-09-30 (×2): qty 50

## 2013-09-30 MED ORDER — IPRATROPIUM-ALBUTEROL 0.5-2.5 (3) MG/3ML IN SOLN
3.0000 mL | RESPIRATORY_TRACT | Status: DC
  Administered 2013-09-30: 3 mL via RESPIRATORY_TRACT
  Filled 2013-09-30: qty 3

## 2013-09-30 MED ORDER — VANCOMYCIN HCL IN DEXTROSE 1-5 GM/200ML-% IV SOLN
1000.0000 mg | Freq: Once | INTRAVENOUS | Status: AC
Start: 2013-09-30 — End: 2013-09-30
  Administered 2013-09-30: 1000 mg via INTRAVENOUS
  Filled 2013-09-30: qty 200

## 2013-09-30 MED ORDER — FENTANYL CITRATE 0.05 MG/ML IJ SOLN
100.0000 ug | Freq: Once | INTRAMUSCULAR | Status: AC
  Administered 2013-09-30: 100 ug via INTRAVENOUS

## 2013-09-30 MED ORDER — CHLORHEXIDINE GLUCONATE 0.12 % MT SOLN
15.0000 mL | Freq: Four times a day (QID) | OROMUCOSAL | Status: DC
  Administered 2013-09-30 – 2013-10-02 (×7): 15 mL via OROMUCOSAL
  Filled 2013-09-30 (×6): qty 15

## 2013-09-30 MED ORDER — ALLOPURINOL 100 MG PO TABS
100.0000 mg | ORAL_TABLET | Freq: Every day | ORAL | Status: DC
  Filled 2013-09-30: qty 1

## 2013-09-30 MED ORDER — INSULIN ASPART 100 UNIT/ML IV SOLN
8.0000 [IU] | Freq: Once | INTRAVENOUS | Status: AC
  Administered 2013-09-30: 8 [IU] via INTRAVENOUS
  Filled 2013-09-30: qty 0.08

## 2013-09-30 MED ORDER — PIPERACILLIN-TAZOBACTAM IN DEX 2-0.25 GM/50ML IV SOLN
2.2500 g | Freq: Four times a day (QID) | INTRAVENOUS | Status: DC
  Administered 2013-09-30 – 2013-10-02 (×8): 2.25 g via INTRAVENOUS
  Filled 2013-09-30 (×14): qty 50

## 2013-09-30 MED ORDER — DILTIAZEM HCL 100 MG IV SOLR: 5.0000 mg/h | INTRAVENOUS | Status: DC

## 2013-09-30 MED ORDER — MIDAZOLAM HCL 2 MG/2ML IJ SOLN
2.0000 mg | INTRAMUSCULAR | Status: DC | PRN
  Administered 2013-09-30: 1 mg via INTRAVENOUS
  Administered 2013-10-01 (×2): 2 mg via INTRAVENOUS
  Administered 2013-10-01: 0.5 mg via INTRAVENOUS
  Administered 2013-10-01: 2 mg via INTRAVENOUS
  Administered 2013-10-01: 1 mg via INTRAVENOUS
  Filled 2013-09-30 (×6): qty 2

## 2013-09-30 MED ORDER — BUDESONIDE-FORMOTEROL FUMARATE 160-4.5 MCG/ACT IN AERO
2.0000 | INHALATION_SPRAY | Freq: Two times a day (BID) | RESPIRATORY_TRACT | Status: DC
  Administered 2013-09-30: 2 via RESPIRATORY_TRACT
  Filled 2013-09-30: qty 6

## 2013-09-30 MED ORDER — FENTANYL BOLUS VIA INFUSION
25.0000 ug | INTRAVENOUS | Status: DC | PRN
  Filled 2013-09-30: qty 50

## 2013-09-30 MED ORDER — ADULT MULTIVITAMIN W/MINERALS CH
1.0000 | ORAL_TABLET | Freq: Every day | ORAL | Status: DC
Start: 2013-09-30 — End: 2013-09-30

## 2013-09-30 MED ORDER — DILTIAZEM LOAD VIA INFUSION
10.0000 mg | Freq: Once | INTRAVENOUS | Status: AC
  Administered 2013-09-30: 10 mg via INTRAVENOUS
  Filled 2013-09-30: qty 10

## 2013-09-30 MED ORDER — DILTIAZEM HCL 100 MG IV SOLR
5.0000 mg/h | INTRAVENOUS | Status: DC
  Administered 2013-10-01 – 2013-10-02 (×2): 5 mg/h via INTRAVENOUS
  Filled 2013-09-30 (×2): qty 100

## 2013-09-30 MED ORDER — ONDANSETRON HCL 4 MG/2ML IJ SOLN
4.0000 mg | Freq: Four times a day (QID) | INTRAMUSCULAR | Status: DC | PRN
  Administered 2013-09-30: 4 mg via INTRAVENOUS

## 2013-09-30 MED ORDER — GABAPENTIN 300 MG PO CAPS: 300.0000 mg | ORAL_CAPSULE | Freq: Every day | ORAL | Status: DC

## 2013-09-30 MED ORDER — NITROGLYCERIN 0.4 MG SL SUBL: 0.4000 mg | SUBLINGUAL_TABLET | SUBLINGUAL | Status: DC | PRN

## 2013-09-30 MED ORDER — PIPERACILLIN-TAZOBACTAM 3.375 G IVPB 30 MIN
3.3750 g | Freq: Once | INTRAVENOUS | Status: AC
  Administered 2013-09-30: 3.375 g via INTRAVENOUS
  Filled 2013-09-30: qty 50

## 2013-09-30 MED ORDER — ONDANSETRON HCL 4 MG/2ML IJ SOLN
INTRAMUSCULAR | Status: AC
  Filled 2013-09-30: qty 2

## 2013-09-30 MED ORDER — DILTIAZEM HCL 100 MG IV SOLR
5.0000 mg/h | Freq: Once | INTRAVENOUS | Status: DC
  Filled 2013-09-30: qty 100

## 2013-09-30 MED ORDER — IPRATROPIUM-ALBUTEROL 0.5-2.5 (3) MG/3ML IN SOLN
3.0000 mL | Freq: Four times a day (QID) | RESPIRATORY_TRACT | Status: DC
  Administered 2013-09-30 – 2013-10-02 (×8): 3 mL via RESPIRATORY_TRACT
  Filled 2013-09-30 (×8): qty 3

## 2013-09-30 NOTE — Consult Note (Signed)
PULMONARY / CRITICAL CARE MEDICINE   Name: Scott Reese MRN: [id number] DOB: 10-29-1927    ADMISSION DATE:  09/29/2013 CONSULTATION DATE:  09/23/2013  REFERRING MD :  Izola Price  REASON FOR CONSULTATION:  Aspiration and acute resp failure  INITIAL PRESENTATION:  63 alcoholic admitted 7/25 with acute-on-chronic renal failure, altered MS, urinary retention, ascites, possible mild ileus. Experienced emesis, witnessed aspiration and then resp arrest 7/25 > intubated and underwent brief CPR. PCCM assumed care 7/25.   STUDIES:  7/25 CT abd/ pelvis >> cirrhosis, moderate to large ascites, proximal and mid SB distension and fluid, suspected ileus. R effusion with associated atx.   SIGNIFICANT EVENTS: 7/25 Admitted with AMS, renal failure, A Fib + RVR  7/25 resp arrest after emesis and aspiration   HISTORY OF PRESENT ILLNESS:  85 with alcoholic cirrhosis, COPD, CAD, HTN + diastolic CHF, A Fib on coumadin. He was admitted 7/25 by [facility], presented to ED with altered MS, perineal pain, abd distension, dysuria. Per ED and admission notes he had been experiencing lower abd pain for a week, had been treated with abx for suspected prostatitis as an outpt. He had poor appetite and severe constipation.  He was admitted with Acute renal failure, suspected to be obstructive uropathy vs dehydration, hyperkalemia,  A fib + RVR treated with diltiazem gtt. 7/25 am he experienced large coffee ground emesis and witness aspiration, rapidly progressing to hypoxemia resp failure and arrest. He was intubated and required CPR x 7 min.   PAST MEDICAL HISTORY :  Past Medical History  Diagnosis Date  . COPD (chronic obstructive pulmonary disease)     Severe  . Hyperlipidemia   . Diastolic CHF   . Hypertension   . GERD (gastroesophageal reflux disease)   . Alcohol abuse   . Diastolic heart failure April 2013    Normal EF, mild AR, mild MR, moderate LAE, mod-severely RAE and severe TR  . Chronic anticoagulation     on  Coumadin  . Atrial fibrillation   . Coronary artery disease     40-50% LCx by cath 2004   Past Surgical History  Procedure Laterality Date  . Transesophageal echocardiogram  06/18/99    no evidence of any clot in the L atruim or L atrial appendage & no mitral regurgiation/small central jet of aortic insufficiency  . Cardioversion  06/18/99    electrical atrical/ converted after single 200 watt-sec shock with AP paddles/ tolerated procedure well  . Breast surgery    . Cardiac catheterization  2004    EF 50%/L circumflex artery very lg vessel/proximal vessel is approx. 33mm/40-50% eccentric stenosis proximal aspect of vessel   Prior to Admission medications   Medication Sig Start Date End Date Taking? Authorizing Provider  allopurinol (ZYLOPRIM) 100 MG tablet Take 100 mg by mouth daily. 11/16/12  Yes Cassell Clement, MD  atorvastatin (LIPITOR) 10 MG tablet Take 10 mg by mouth daily.   Yes Historical Provider, MD  budesonide-formoterol (SYMBICORT) 160-4.5 MCG/ACT inhaler Inhale 2 puffs into the lungs 2 (two) times daily.   Yes Historical Provider, MD  furosemide (LASIX) 40 MG tablet Take 40 mg by mouth daily.   Yes Historical Provider, MD  gabapentin (NEURONTIN) 300 MG capsule Take 1 capsule (300 mg total) by mouth at bedtime. 07/18/13  Yes Cassell Clement, MD  metoprolol (LOPRESSOR) 50 MG tablet Take 50 mg by mouth 2 (two) times daily.   Yes Historical Provider, MD  Multiple Vitamin (MULTIVITAMIN WITH MINERALS) TABS tablet Take 1 tablet by  mouth daily.   Yes Historical Provider, MD  nitroGLYCERIN (NITROSTAT) 0.4 MG SL tablet Place 0.4 mg under the tongue every 5 (five) minutes as needed for chest pain.   Yes Historical Provider, MD  tiotropium (SPIRIVA) 18 MCG inhalation capsule Place 18 mcg into inhaler and inhale daily.   Yes Historical Provider, MD  warfarin (COUMADIN) 2.5 MG tablet Take 2.5 mg by mouth daily.   Yes Historical Provider, MD   No Known Allergies  FAMILY HISTORY:  Family  History  Problem Relation Age of Onset  . Diabetes Father   . Stroke Brother   . Cancer Daughter    SOCIAL HISTORY:  reports that he quit smoking about 24 years ago. His smoking use included Cigarettes. He has a 80 pack-year smoking history. He has never used smokeless tobacco. He reports that he drinks about 1.2 ounces of alcohol per week. He reports that he does not use illicit drugs.  REVIEW OF SYSTEMS:  Pt was complaining of perineal pain per report  SUBJECTIVE:  Unresponsive, unable to interact. Prior to resp arrest pt was confused but speaking to the nurse  VITAL SIGNS: Temp:  [97.8 F (36.6 C)-98.7 F (37.1 C)] 98 F (36.7 C) (07/25 0800) Pulse Rate:  [35-149] 35 (07/25 0900) Resp:  [20-34] 30 (07/25 0900) BP: (104-163)/(47-97) 107/62 mmHg (07/25 0900) SpO2:  [74 %-100 %] 88 % (07/25 0900) Weight:  [81 kg (178 lb 9.2 oz)-90.399 kg (199 lb 4.7 oz)] 81 kg (178 lb 9.2 oz) (07/25 0545) HEMODYNAMICS:   VENTILATOR SETTINGS:   INTAKE / OUTPUT:  Intake/Output Summary (Last 24 hours) at 24-Aug-2013 1044 Last data filed at 24-Aug-2013 0900  Gross per 24 hour  Intake    345 ml  Output    120 ml  Net    225 ml    PHYSICAL EXAMINATION: General:  Ill appearing unresponsive man, emesis being suctioned from mouth Neuro:  obtunded HEENT:  Emesis, pupils 4mm and react, ETT just placed Cardiovascular:  Tachy and irregular, distant Lungs:  Coarse and distant, scattered crackles.  Abdomen:  Distended, fluid wave present, tympany present Musculoskeletal:  No deformities Skin:  Cool, no rashes  LABS:  CBC  Recent Labs Lab 24-Aug-2013 0057  WBC 11.0*  HGB 12.7*  HCT 40.6  PLT 367   Coag's  Recent Labs Lab 24-Aug-2013 0057  APTT 45*  INR 2.16*   BMET  Recent Labs Lab 24-Aug-2013 0057 24-Aug-2013 0431 24-Aug-2013 0734  NA 137  --  138  K 5.8* 5.7* 5.8*  CL 93*  --  95*  CO2 26  --  27  BUN 101*  --  101*  CREATININE 4.31*  --  4.29*  GLUCOSE 111*  --  89    Electrolytes  Recent Labs Lab 24-Aug-2013 0057 24-Aug-2013 0431 24-Aug-2013 0734  CALCIUM 9.5  --  9.2  MG  --  3.5*  --    Sepsis Markers  Recent Labs Lab 24-Aug-2013 0114  LATICACIDVEN 1.65   ABG No results found for this basename: PHART, PCO2ART, PO2ART,  in the last 168 hours Liver Enzymes  Recent Labs Lab 24-Aug-2013 0057  AST 36  ALT 23  ALKPHOS 87  BILITOT 0.2*  ALBUMIN 2.5*   Cardiac Enzymes  Recent Labs Lab 24-Aug-2013 0057 24-Aug-2013 0058  TROPONINI <0.30  --   PROBNP  --  18240.0*   Glucose  Recent Labs Lab 24-Aug-2013 0742  GLUCAP 93    Imaging No results found.   ASSESSMENT / PLAN:  PULMONARY OETT 7/25 >>  A: Acute respiratory failure Aspiration event, high risk for aspiration PNA or ARDS COPD, tobacco hx P:   - PRVC with Vt 600cc for now, low threshold to change to low-Vt vent strategy if he evolves pulmonary infiltrates.  - scheduled BD's - continue solumedrol for suspected component AE-COPD - abx as below - discussed the utility of MV and overall prognosis with his granddaughter Raynelle Fanning at bedside. Will discuss further with the patient's wife and grandson. I would advise short term MV to see if he shows evidence for meaningful recovery. If no improvement then would not support prolonged intubation and MV  CARDIOVASCULAR CVL none A: A fib with RVR CAD Hypotension P:  - dilt gtt and metoprolol held in aftermath of acute resp event - IVF support - consider pressors depending on discussion with family regarding goals for care - hold anticoagulation at this time (on coumadin outpt) - follow troponin and BNP  RENAL A:  Acute renal failure, suspect due to dehydration and poor PO intake over the last week. Consider also an obstructive nephropathy w hx prostatitis and oliguria.  Hyperkalemia P:   - kayexalate given x 1 on presentation - IVF support, bolused IVF - follow BMP and treat hyperkalemia as indicated - have discussed with family 7/25 that  he is a poor candidate for HD, would not want dependent lifestyle.  - ABG and BMP now and follow - agree with renal US  GASTROINTESTINAL A:  Cirrhosis Small Bowel Obstruction Ascites Possible GIB > dark emesis P:   - OGT to suction - follow CBC and GI consultation if we believe GIB an active issue - would NOT perform a large volume paracentesis at this point as he would not tolerate the fluid shifts hemodynamically. He may benefit from a diagnostic paracentesis, but he is already being treated with broad spectrum abx  HEMATOLOGIC A:  Coagulopathy - on coumadin, liver disease P:  - hold coumadin - Vit K - follow LFT  INFECTIOUS A:  Suspected prostatitis on presentation High risk for aspiration PNA At risk for SBP P:   BCx2 7/25 >>  UC 7/25 >>  Sputum7/25 >>  Abx:  Vanco, start date 7/25, day 1/x  Zosyn, start date 7/25, day1/x  ENDOCRINE A:  Hyperglycemia, on steroids P:   SSI per protocol  NEUROLOGIC A:  Sedation while ventilated P:   RASS goal: -1 to 0 - fentanyl gtt ordered - add versed prn if needed  TODAY'S SUMMARY:  Admitted with renal failure, altered MS, superimposed on cirrhosis, COPD, A fib, CAD. Unfortunately experienced emesis, aspiration and resp arrest requiring ETT/MV.   Addendum: I had a family meeting with the patient's wife, grandson and granddaughter today 7/25. Explained the events of today and his illness on presentation. I have explained that prognosis for a meaningful recovery to his previous good QOL is poor but not impossible. They all agree that he would not want a life that was characterized by dependent care, SNF, etc. I have recommended that we support him aggressively medically with abx, MV, IVF, etc and follow for 24-48 h to see if he progresses. They agree and do not want prolonged support if he doesn't have a good chance to recover to a good QOL. We will defer CPR/ACLS. If he needs pressors then we would use phenylephrine via peripheral IV  temporarily. We have agreed that HD would not be beneficial. I will update them on 7/26 and we will decide about continued support  at that time. Suspect that they will desire withdrawal of care if he does not show improvement in next 2 days.   I have personally obtained a history, examined the patient, evaluated laboratory and imaging results, formulated the assessment and plan and placed orders.  CRITICAL CARE: The patient is critically ill with multiple organ systems failure and requires high complexity decision making for assessment and support, frequent evaluation and titration of therapies, application of advanced monitoring technologies and extensive interpretation of multiple databases. Critical Care Time devoted to patient care services described in this note is 120 minutes total.   Levy Pupa, MD, PhD 10/06/2013, 12:26 PM [facility] [phone] or if no answer [phone]

## 2013-09-30 NOTE — Consult Note (Signed)
Patient WU:JWJXB Scott Reese      DOB: October 02, 1927      JYN:[id number]     Consult Note from the Palliative Medicine Team at [facility]    Consult Requested by: Dr Izola Price    PCP: No PCP Per Patient Reason for Consultation: Goals of Care     Phone Number:None  Assessment/Recommendations: 80 yo male with multiple medical problems including likely etoh cirrhosis who presented with pain constipation, AKI, and subsequent PEA arrest with ROSC after 7 min.    Goals of Care: 1.  Code Status partial. No cpr  2. Goals of Care: Unfortunately, I was unable to talk with family much today. Mostly spoke with spouse Synetta Fail and son in Social worker [place].  While I was talking with the Mr Scott Reese went into acute resp distress with difficulty ventilating on respirator. We also talked about current medical interventions and long-term disease concerns with multiple comorbidities and likely advanced liver disease in setting of chronic etoh use.  Cirrhotic liver with ascites, low albumin  likely indicator of advanced nature of disease. Goals of care discussion by CCM and currently partial code with plan to see how he does over next hours to days. Key points from GOC discussion are that Mr Scott Reese would not want a life dependent on SNF care.    3. Symptom Management:   1. Pain/Resp Abn- On fentanyl infusion per ICU protocol. Will monitor symptoms if we reach point of extubation.  4. Psychosocial: Lives at home with wife. Heavy etoh use which family is not sure about recent use. Declining physically over past weeks to months. His daughter passed away. Sounds like he is close with his grandosn whose father Mellody Dance (son-in-law) was present and supporting family today.            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  He was found to have  afib w/RVR on admission as well.  He was admitted this morning and unfortunately underwent PEA arrest after large coffee ground emesis episode.  He required CPR x7 minutes and epi x2 per documentation.  Dr Delton Coombes had family meeting today as he has documented.    I spoke with family today at bedside. They state that Scott Reese has really been declining lately over past weeks to months. Had been having some "issues with his prostate" but not eating much, and harder to get around.  Talked family through medical responses of his acute resp issues.   ROS: Unable to obtain 2/2 mechanical ventilation    PMH:  Past Medical History  Diagnosis Date  . COPD (chronic obstructive pulmonary disease)     Severe  . Hyperlipidemia   . Diastolic CHF   . Hypertension   . GERD (gastroesophageal reflux disease)   . Alcohol abuse   . Diastolic heart failure April 2013    Normal EF, mild AR, mild MR, moderate LAE, mod-severely RAE and severe TR  . Chronic anticoagulation     on Coumadin  . Atrial fibrillation   . Coronary artery disease     40-50% LCx by cath 2004     PSH: Past Surgical History  Procedure Laterality Date  . Transesophageal echocardiogram  06/18/99    no evidence of any clot in the L atruim or L atrial appendage & no mitral regurgiation/small central jet  of aortic insufficiency  . Cardioversion  06/18/99    electrical atrical/ converted after single 200 watt-sec shock with AP paddles/ tolerated procedure well  . Breast surgery    . Cardiac catheterization  2004    EF 50%/L circumflex artery very lg vessel/proximal vessel is approx. 74mm/40-50% eccentric stenosis proximal aspect of vessel   I have reviewed the FH and SH and  If appropriate update it with new information. No Known Allergies Scheduled Meds: . allopurinol  100 mg Oral Daily  . budesonide-formoterol  2 puff Inhalation BID  . fentaNYL  50 mcg Intravenous Once  . insulin aspart  0-9 Units Subcutaneous TID WC  .  ipratropium-albuterol  3 mL Nebulization Q6H  . methylPREDNISolone (SOLU-MEDROL) injection  60 mg Intravenous 4 times per day  . ondansetron      . phytonadione (VITAMIN K) IV  5 mg Intravenous Once  . piperacillin-tazobactam (ZOSYN)  IV  2.25 g Intravenous 4 times per day  . sodium chloride  3 mL Intravenous Q12H  . [START ON May 06, 2013] vancomycin  1,000 mg Intravenous Q48H   Continuous Infusions: . diltiazem (CARDIZEM) infusion 5 mg/hr (09/09/2013 0600)  . fentaNYL infusion INTRAVENOUS 50 mcg/hr (09/21/2013 1250)   PRN Meds:.fentaNYL, morphine injection, ondansetron (ZOFRAN) IV, oxyCODONE    BP 101/65  Pulse 41  Temp(Src) 97.4 F (36.3 C) (Oral)  Resp 14  Ht 5\' 8"  (1.727 m)  Wt 81 kg (178 lb 9.2 oz)  BMI 27.16 kg/m2  SpO2 99%     Intake/Output Summary (Last 24 hours) at 09/14/2013 1339 Last data filed at 09/29/2013 1300  Gross per 24 hour  Intake   1405 ml  Output    200 ml  Net   1205 ml    Physical Exam:  General: intubated, developed agitation 2/2 resp distress HEENT: Tool, sclera anicteric Chest:   Decreased breath sounds bilaterally with tachypnea.  CVS: tachy Abdomen:distended Ext: lower ext edema Neuro: not following commands  Labs: CBC    Component Value Date/Time   WBC 15.1* 09/24/2013 1219   RBC 4.91 09/19/2013 1219   HGB 11.8* 10/03/2013 1219   HCT 39.2 09/18/2013 1219   PLT 375 09/16/2013 1219   MCV 79.8 09/06/2013 1219   MCH 24.0* 10/01/2013 1219   MCHC 30.1 10/01/2013 1219   RDW 16.6* 09/17/2013 1219   LYMPHSABS 0.6* 10/03/2013 0057   MONOABS 0.9 09/21/2013 0057   EOSABS 0.0 09/17/2013 0057   BASOSABS 0.0 09/27/2013 0057    BMET    Component Value Date/Time   NA 138 10/03/2013 1219   K 6.2* 10/01/2013 1219   CL 95* 10/05/2013 1219   CO2 23 09/06/2013 1219   GLUCOSE 153* 10/05/2013 1219   BUN 103* 09/10/2013 1219   CREATININE 4.59* 10/03/2013 1219   CALCIUM 9.3 09/16/2013 1219   GFRNONAA 11* 09/23/2013 1219   GFRAA 12* 09/28/2013 1219    CMP      Component Value Date/Time   NA 138 10/03/2013 1219   K 6.2* 09/15/2013 1219   CL 95* 09/19/2013 1219   CO2 23 09/23/2013 1219   GLUCOSE 153* 09/28/2013 1219   BUN 103* 09/25/2013 1219   CREATININE 4.59* 09/08/2013 1219   CALCIUM 9.3 09/09/2013 1219   PROT 6.8 09/10/2013 1219   ALBUMIN 2.2* 10/03/2013 1219   AST 40* 09/09/2013 1219   ALT 24 10/01/2013 1219   ALKPHOS 86 09/18/2013 1219   BILITOT 0.3 10/01/2013 1219   GFRNONAA 11* 09/28/2013 1219   GFRAA 12*  09/13/2013 1219   7/25 CT Abd/Pelvis IMPRESSION:  Changes in the liver compatible with cirrhosis. There is associated  moderate to large volume ascites.  Proximal and mid small bowel is fluid filled and distended up to 3  cm in diameter. Imaging features may be related to ileus although  the distal small bowel appears decompressed raising the question of  a component of small bowel obstruction.  Right pleural effusion with right lower lobe collapse/consolidation.   7/25 CXR MPRESSION:  Endotracheal tube and NG tubes in good position.  Interval development of pulmonary edema with probable left pleural  effusion and overlying atelectasis    Time In Time Out Total Time Spent with Patient Total Overall Time  135 240 40 minutes 65 minutes    Greater than 50%  of this time was spent counseling and coordinating care related to the above assessment and plan.

## 2013-09-30 NOTE — H&P (Addendum)
Scott Reese is an 80 y.o. male.    This is the addendum to the admission note by Dr. [name].   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 Admitted with altered mental status, ARF, urinary retention, severe ascites. I have seen pt at bedside this AM, pt was alert and able to follow commands. However, he has very quickly deteriorated clinically, pt coded and currently intubated. Status changed to ICU. I have tried to call wife Scott Reese but no answer, left message.  Scott Ramsay, MD  [facility] Pager [phone] Cell [phone]  If 7PM-7AM, please contact night-coverage [email or web address] Password [email or web address]   Chief Complaint: Abdominal pain and weakness. HPI:Pt is a chronically ill appearing elderly male who states that he came to the ED for the above complaints.  He states that he has had lower abdominal pain and severe dysuria for about six days.  He states that he has had not appetite and has not eaten for this length of time. He states that he has not had a BM for 7-10 days.  He also states that the low abdominal pain that he is experiencing for him is typical of a prostate infection which he has had before.  He denies chest pain, palpitations, shortness of breath, or fever or chills.  He states that his belly has been big for about 6 weeks.  He has also noticed some swelling in his lower extremities.  Past Medical History  Diagnosis Date  . COPD (chronic obstructive pulmonary disease)     Severe  . Hyperlipidemia   . Diastolic CHF   . Hypertension   . GERD (gastroesophageal reflux disease)   . Alcohol abuse   . Diastolic heart failure April 2013    Normal EF, mild AR, mild MR, moderate LAE,  mod-severely RAE and severe TR  . Chronic anticoagulation     on Coumadin  . Atrial fibrillation   . Coronary artery disease     40-50% LCx by cath 2004    Past Surgical History  Procedure Laterality Date  . Transesophageal echocardiogram  06/18/99    no evidence of any clot in the L atruim or L atrial appendage & no mitral regurgiation/small central jet of aortic insufficiency  . Cardioversion  06/18/99    electrical atrical/ converted after single 200 watt-sec shock with AP paddles/ tolerated procedure well  . Breast surgery    . Cardiac catheterization  2004    EF 50%/L circumflex artery very lg vessel/proximal vessel is approx. 70m/40-50% eccentric stenosis proximal aspect of vessel    Family History  Problem Relation Age of Onset  . Diabetes Father   . Stroke Brother   . Cancer Daughter    Social History:  reports that he quit smoking about 24 years ago. His smoking use included Cigarettes. He has a 80 pack-year smoking history. He has never used smokeless tobacco. He reports that he drinks about 1.2 ounces of alcohol per week. He reports that he does not use illicit drugs.  Allergies: No Known Allergies  Medications Prior to Admission  Medication Sig Dispense Refill  . allopurinol (ZYLOPRIM) 100 MG tablet Take 100 mg by mouth daily.      .Marland Kitchenatorvastatin (LIPITOR) 10 MG tablet Take 10 mg by mouth daily.      .Marland Kitchen  budesonide-formoterol (SYMBICORT) 160-4.5 MCG/ACT inhaler Inhale 2 puffs into the lungs 2 (two) times daily.      . furosemide (LASIX) 40 MG tablet Take 40 mg by mouth daily.      Marland Kitchen gabapentin (NEURONTIN) 300 MG capsule Take 1 capsule (300 mg total) by mouth at bedtime.  30 capsule  5  . metoprolol (LOPRESSOR) 50 MG tablet Take 50 mg by mouth 2 (two) times daily.      . Multiple Vitamin (MULTIVITAMIN WITH MINERALS) TABS tablet Take 1 tablet by mouth daily.      . nitroGLYCERIN (NITROSTAT) 0.4 MG SL tablet Place 0.4 mg under the tongue every 5 (five) minutes as needed  for chest pain.      Marland Kitchen tiotropium (SPIRIVA) 18 MCG inhalation capsule Place 18 mcg into inhaler and inhale daily.      Marland Kitchen warfarin (COUMADIN) 2.5 MG tablet Take 2.5 mg by mouth daily.        Results for orders placed during the hospital encounter of 09/08/2013 (from the past 48 hour(s))  CBC WITH DIFFERENTIAL     Status: Abnormal   Collection Time    09/08/2013 12:57 AM      Result Value Ref Range   WBC 11.0 (*) 4.0 - 10.5 K/uL   RBC 5.22  4.22 - 5.81 MIL/uL   Hemoglobin 12.7 (*) 13.0 - 17.0 g/dL   HCT 40.6  39.0 - 52.0 %   MCV 77.8 (*) 78.0 - 100.0 fL   MCH 24.3 (*) 26.0 - 34.0 pg   MCHC 31.3  30.0 - 36.0 g/dL   RDW 16.5 (*) 11.5 - 15.5 %   Platelets 367  150 - 400 K/uL   Neutrophils Relative % 86 (*) 43 - 77 %   Neutro Abs 9.5 (*) 1.7 - 7.7 K/uL   Lymphocytes Relative 6 (*) 12 - 46 %   Lymphs Abs 0.6 (*) 0.7 - 4.0 K/uL   Monocytes Relative 8  3 - 12 %   Monocytes Absolute 0.9  0.1 - 1.0 K/uL   Eosinophils Relative 0  0 - 5 %   Eosinophils Absolute 0.0  0.0 - 0.7 K/uL   Basophils Relative 0  0 - 1 %   Basophils Absolute 0.0  0.0 - 0.1 K/uL  COMPREHENSIVE METABOLIC PANEL     Status: Abnormal   Collection Time    09/10/2013 12:57 AM      Result Value Ref Range   Sodium 137  137 - 147 mEq/L   Potassium 5.8 (*) 3.7 - 5.3 mEq/L   Chloride 93 (*) 96 - 112 mEq/L   CO2 26  19 - 32 mEq/L   Glucose, Bld 111 (*) 70 - 99 mg/dL   BUN 101 (*) 6 - 23 mg/dL   Creatinine, Ser 4.31 (*) 0.50 - 1.35 mg/dL   Calcium 9.5  8.4 - 10.5 mg/dL   Total Protein 7.3  6.0 - 8.3 g/dL   Albumin 2.5 (*) 3.5 - 5.2 g/dL   AST 36  0 - 37 U/L   ALT 23  0 - 53 U/L   Alkaline Phosphatase 87  39 - 117 U/L   Total Bilirubin 0.2 (*) 0.3 - 1.2 mg/dL   GFR calc non Af Amer 12 (*) >90 mL/min   GFR calc Af Amer 13 (*) >90 mL/min   Comment: (NOTE)     The eGFR has been calculated using the CKD EPI equation.     This calculation has not been validated  in all clinical situations.     eGFR's persistently <90 mL/min  signify possible Chronic Kidney     Disease.   Anion gap 18 (*) 5 - 15  TROPONIN I     Status: None   Collection Time    09/08/2013 12:57 AM      Result Value Ref Range   Troponin I <0.30  <0.30 ng/mL   Comment:            Due to the release kinetics of cTnI,     a negative result within the first hours     of the onset of symptoms does not rule out     myocardial infarction with certainty.     If myocardial infarction is still suspected,     repeat the test at appropriate intervals.  PROTIME-INR     Status: Abnormal   Collection Time    09/12/2013 12:57 AM      Result Value Ref Range   Prothrombin Time 24.1 (*) 11.6 - 15.2 seconds   INR 2.16 (*) 0.00 - 1.49  APTT     Status: Abnormal   Collection Time    09/09/2013 12:57 AM      Result Value Ref Range   aPTT 45 (*) 24 - 37 seconds   Comment:            IF BASELINE aPTT IS ELEVATED,     SUGGEST PATIENT RISK ASSESSMENT     BE USED TO DETERMINE APPROPRIATE     ANTICOAGULANT THERAPY.  PRO B NATRIURETIC PEPTIDE     Status: Abnormal   Collection Time    09/14/2013 12:58 AM      Result Value Ref Range   Pro B Natriuretic peptide (BNP) 18240.0 (*) 0 - 450 pg/mL  URINALYSIS, ROUTINE W REFLEX MICROSCOPIC     Status: Abnormal   Collection Time    09/26/2013  1:14 AM      Result Value Ref Range   Color, Urine YELLOW  YELLOW   APPearance CLOUDY (*) CLEAR   Specific Gravity, Urine 1.018  1.005 - 1.030   pH 5.0  5.0 - 8.0   Glucose, UA NEGATIVE  NEGATIVE mg/dL   Hgb urine dipstick LARGE (*) NEGATIVE   Bilirubin Urine NEGATIVE  NEGATIVE   Ketones, ur NEGATIVE  NEGATIVE mg/dL   Protein, ur NEGATIVE  NEGATIVE mg/dL   Urobilinogen, UA 0.2  0.0 - 1.0 mg/dL   Nitrite NEGATIVE  NEGATIVE   Leukocytes, UA NEGATIVE  NEGATIVE  I-STAT CG4 LACTIC ACID, ED     Status: None   Collection Time    09/13/2013  1:14 AM      Result Value Ref Range   Lactic Acid, Venous 1.65  0.5 - 2.2 mmol/L  URINE MICROSCOPIC-ADD ON     Status: None   Collection Time     09/16/2013  1:14 AM      Result Value Ref Range   RBC / HPF 21-50  <3 RBC/hpf   Urine-Other AMORPHOUS URATES/PHOSPHATES    BLOOD GAS, VENOUS     Status: Abnormal   Collection Time    09/14/2013  2:13 AM      Result Value Ref Range   O2 Content 3.0     Delivery systems NASAL CANNULA     pH, Ven 7.326 (*) 7.250 - 7.300   pCO2, Ven 51.7 (*) 45.0 - 50.0 mmHg   pO2, Ven 46.1 (*) 30.0 - 45.0 mmHg   Bicarbonate 26.2 (*)  20.0 - 24.0 mEq/L   TCO2 23.7  0 - 100 mmol/L   Acid-base deficit 0.0  0.0 - 2.0 mmol/L   O2 Saturation 73.3     Patient temperature 98.6     Collection site VEIN     Drawn by COLLECTED BY NURSE     Sample type VENOUS    POTASSIUM     Status: Abnormal   Collection Time    10/01/2013  4:31 AM      Result Value Ref Range   Potassium 5.7 (*) 3.7 - 5.3 mEq/L  MAGNESIUM     Status: Abnormal   Collection Time    09/06/2013  4:31 AM      Result Value Ref Range   Magnesium 3.5 (*) 1.5 - 2.5 mg/dL   Ct Abdomen Pelvis Wo Contrast  09/29/2013   CLINICAL DATA:  Weakness with distention. Concern for ascites secondary to distended abdomen.  EXAM: CT ABDOMEN AND PELVIS WITHOUT CONTRAST  TECHNIQUE: Multidetector CT imaging of the abdomen and pelvis was performed following the standard protocol without IV contrast.  COMPARISON:  CT chest from 02/29/2012.  FINDINGS: Lung Bases: Right base collapse/ consolidation noted with small to moderate right pleural effusion. Stable appearance of calcified nodal tissue in the right hilum. The heart is enlarged. Coronary artery calcification is noted.  Liver: Liver it appear small and has a peripheral nodular contour, consistent with cirrhosis. 6 mm hypoattenuating lesion in the anterior segment of the right liver (image 27 series 2) is unchanged since the prior chest CT.  Spleen: Normal uninfused features.  No evidence for splenomegaly.  Stomach: Nondistended. No gastric wall thickening. No evidence of outlet obstruction.  Pancreas: No focal mass lesion. No  dilatation of the main duct. No intraparenchymal cyst. No peripancreatic edema.  Gallbladder/Biliary: No evidence for gallstones. No pericholecystic fluid. No intrahepatic or extrahepatic biliary dilation.  Kidneys/Adrenals: No adrenal nodule. No hydronephrosis in either kidney. No renal mass is evident on this uninfused exam.  Bowel Loops: Duodenum is normally positioned as is the ligament of Treitz. Mild small bowel distention throughout, measuring up to 3.0 cm in diameter. Small bowel loops are fluid-filled. Loops in the distal ileum or decompressed. The terminal ileum is decompressed but otherwise unremarkable. The appendix is not visualized, but there is no edema or inflammation in the region of the cecum. Diverticular changes are seen in the left colon without diverticulitis.  Nodes: No abdominal lymphadenopathy. No pelvic sidewall lymphadenopathy.  Vasculature: Atherosclerotic calcification is noted in the wall of the abdominal aorta without aneurysm.  Pelvic Genitourinary: Foley catheter decompresses the urinary bladder. Prostate gland is unremarkable.  Bones/Musculoskeletal: Bone windows reveal no worrisome lytic or sclerotic osseous lesions.  Body Wall: Small left inguinal hernia contains only fat.  Other: Moderate volume ascites is seen. Nodularity in the omentum may be related to edema.  IMPRESSION: Changes in the liver compatible with cirrhosis. There is associated moderate to large volume ascites.  Proximal and mid small bowel is fluid filled and distended up to 3 cm in diameter. Imaging features may be related to ileus although the distal small bowel appears decompressed raising the question of a component of small bowel obstruction.  Right pleural effusion with right lower lobe collapse/consolidation.   Electronically Signed   By: Misty Stanley M.D.   On: 09/06/2013 02:52   Dg Chest Port 1 View  10/03/2013   CLINICAL DATA:  Shortness of breath, abdominal pain and distention.  EXAM: PORTABLE CHEST  - 1 VIEW  COMPARISON:  Chest radiograph performed 06/30/2011, and CT of the chest performed 02/29/2012  FINDINGS: The lungs are well-aerated. Mild bibasilar airspace opacities raise question for mild pneumonia, though minimal interstitial edema might have a similar appearance. A calcified granuloma is noted at the medial right lung base. No definite pleural effusion or pneumothorax is seen.  The cardiomediastinal silhouette is mildly enlarged. Mild vascular congestion is noted. No acute osseous abnormalities are seen.  IMPRESSION: 1. Mild bibasilar airspace opacities raise question for mild pneumonia, though minimal interstitial edema might have a similar appearance. 2. Mild vascular congestion and mild cardiomegaly noted.   Electronically Signed   By: Garald Balding M.D.   On: 09/28/2013 02:53    Review of Systems  Constitutional: Positive for malaise/fatigue. Negative for fever, chills and weight loss.  HENT: Positive for congestion. Negative for sore throat.   Eyes: Negative for blurred vision, photophobia and pain.  Respiratory: Positive for cough, sputum production and shortness of breath.   Cardiovascular: Positive for leg swelling. Negative for chest pain, palpitations, orthopnea and PND.  Gastrointestinal: Positive for abdominal pain and constipation. Negative for nausea, vomiting, blood in stool and melena.  Genitourinary: Positive for dysuria, urgency and frequency. Negative for flank pain.  Musculoskeletal: Positive for myalgias. Negative for back pain and joint pain.  Skin: Negative for itching and rash.  Neurological: Positive for weakness. Negative for dizziness, tremors, focal weakness, seizures and headaches.  Endo/Heme/Allergies: Negative for environmental allergies and polydipsia. Does not bruise/bleed easily.  Psychiatric/Behavioral: Negative for depression, memory loss and substance abuse. The patient is not nervous/anxious.     Blood pressure 163/60, pulse 63, temperature 97.8  F (36.6 C), temperature source Oral, resp. rate 20, height 5' 8" (1.727 m), weight 81 kg (178 lb 9.2 oz), SpO2 93.00%. Physical Exam  Constitutional: He is oriented to person, place, and time.  Pt is cachectic and chronically and acutely ill appearing. He is awake and alert and able to give a fair history, but has very poor insight into his personal well-being.  HENT:  Head: Normocephalic and atraumatic.  Nose: Nose normal.  Mouth/Throat: No oropharyngeal exudate.  Eyes: Conjunctivae are normal. Pupils are equal, round, and reactive to light. Scleral icterus is present.  Neck: No JVD present. No tracheal deviation present. No thyromegaly present.  Cardiovascular: Normal rate, regular rhythm, normal heart sounds and intact distal pulses.  Exam reveals no gallop and no friction rub.   No murmur heard. Respiratory: No respiratory distress. He has wheezes. He has no rales. He exhibits no tenderness.  Pt has a wet sounding cough with rhonchi throughout his lung fields. He states that this cough is non productive.  GI: He exhibits distension. He exhibits no mass. There is tenderness. There is no rebound and no guarding.  Positive fluid wave and positive for abdominal tenderness in lower quadrants bilaterally and in the suprapubic region.  Musculoskeletal: He exhibits edema.  1+ pitting edema to lower extremities bilaterally.  Lymphadenopathy:    He has no cervical adenopathy.  Neurological: He is alert and oriented to person, place, and time. He has normal reflexes. He displays normal reflexes. No cranial nerve deficit. He exhibits normal muscle tone.  Skin: Skin is warm and dry. No rash noted. No erythema. No pallor.  Psychiatric: He has a normal mood and affect. His behavior is normal.     Assessment/Plan 1. Atrial fibrillation with RVR - Pt started on a diltiazem gtt in the ED. His rate has come down into the  nineties. He will be admitted to a step down bed and be continued on the diltiazem  gtt.  Cardiology has been consulted.Pt is therapeutic on coumadin. 2. Renal failure - likely acute.  A renal ultrasound has been ordered.  The patient was found to have severe urinary retention in the ED and this may be the reason for the renal failure.  Alternatively the patient apparently has end-stage liver disease and this could be a manifestation of hepato-renal syndrome. A renal ultrasound has been ordered. 3. Hyperkalemia - due to renal failure. The patient received IV D 50 followed by insulin in the ED.  His electrolytes will be carefully followed and corrected as necessary.  4. Cirrhosis of the liver with ascites. - Pt states that he has no knowledge of this problem. Likely due to ETOH. 5.Apparent small bowel obstruction as per CT. However, the patient has no nausea or vomiting. I will not place an NGT at this time. It is possible that the CT appearance of the bowel is simply due to compression due to the abdominal ascites.  6. Acute bronchitis with a wet sounding cough and rhonchi.  The patient will be started on IV antibiotics, nebulizer treatments, and IV steroids.  Scott Reese, AVA 10/03/2013, 6:26 AM

## 2013-09-30 NOTE — Progress Notes (Signed)
Pt continues with poor to <5cc of u/o.  Elink MD informed.

## 2013-09-30 NOTE — Progress Notes (Signed)
ANTIBIOTIC CONSULT NOTE - INITIAL  Pharmacy Consult for Vancomycin and Zosyn  Indication: rule out sepsis  No Known Allergies  Patient Measurements: Height: 5\' 7"  (170.2 cm) Weight: 199 lb 4.7 oz (90.399 kg) IBW/kg (Calculated) : 66.1 Adjusted Body Weight:   Vital Signs: Temp: 98.7 F (37.1 C) (07/25 0051) Temp src: Rectal (07/25 0051) BP: 147/91 mmHg (07/25 0415) Pulse Rate: 59 (07/25 0400) Intake/Output from previous day:   Intake/Output from this shift:    Labs:  Recent Labs  10/03/2013 0057  WBC 11.0*  HGB 12.7*  PLT 367  CREATININE 4.31*   Estimated Creatinine Clearance: 13.9 ml/min (by C-G formula based on Cr of 4.31). No results found for this basename: VANCOTROUGH, VANCOPEAK, VANCORANDOM, GENTTROUGH, GENTPEAK, GENTRANDOM, TOBRATROUGH, TOBRAPEAK, TOBRARND, AMIKACINPEAK, AMIKACINTROU, AMIKACIN,  in the last 72 hours   Microbiology: No results found for this or any previous visit (from the past 720 hour(s)).  Medical History: Past Medical History  Diagnosis Date  . COPD (chronic obstructive pulmonary disease)     Severe  . Hyperlipidemia   . Diastolic CHF   . Hypertension   . GERD (gastroesophageal reflux disease)   . Alcohol abuse   . Diastolic heart failure April 2013    Normal EF, mild AR, mild MR, moderate LAE, mod-severely RAE and severe TR  . Chronic anticoagulation     on Coumadin  . Atrial fibrillation   . Coronary artery disease     40-50% LCx by cath 2004    Medications:  Anti-infectives   Start     Dose/Rate Route Frequency Ordered Stop   2013/05/11 0600  vancomycin (VANCOCIN) IVPB 1000 mg/200 mL premix     1,000 mg 200 mL/hr over 60 Minutes Intravenous Every 48 hours 09/13/2013 0600     09/25/2013 1200  piperacillin-tazobactam (ZOSYN) IVPB 2.25 g     2.25 g 100 mL/hr over 30 Minutes Intravenous 4 times per day 09/24/2013 0600     09/12/2013 0100  piperacillin-tazobactam (ZOSYN) IVPB 3.375 g     3.375 g 100 mL/hr over 30 Minutes Intravenous   Once 10/05/2013 0057 09/24/2013 0232   09/19/2013 0100  vancomycin (VANCOCIN) IVPB 1000 mg/200 mL premix     1,000 mg 200 mL/hr over 60 Minutes Intravenous  Once 09/07/2013 0057 10/03/2013 0419     Assessment: Patient with sepsis.  First dose of antibiotics already given.  Patient with very poor renal function at this time.  Goal of Therapy:  Vancomycin trough level 15-20 mcg/ml Zosyn based on renal function   Plan:  Measure antibiotic drug levels at steady state Follow up culture results Vancomycin 1gm iv q48hr Zosyn 2.25gm iv q6hr  Darlina GuysGrimsley Jr, Jacquenette ShoneJulian Crowford 09/29/2013,6:01 AM

## 2013-09-30 NOTE — ED Notes (Signed)
Pt presented from home by EMS, on quick assessment, pt with pitting edema bilateral, possible ascites as evidenced by distended abdomen, called EMS for generalized weakness, pt in de-saturating in room air, on 3Litres nasal canula at this time. In NAD at this time.

## 2013-09-30 NOTE — Consult Note (Signed)
CARDIOLOGY CONSULT NOTE   Patient ID: Scott Reese MRN: [id number], DOB/AGE: 1927-12-20   Admit date: 09/22/2013 Date of Consult: 10/03/2013   Primary Physician: No PCP Per Patient Primary Cardiologist: Dr. Patty Sermons  Pt. Profile  80 year old gentleman with chronic atrial fibrillation on Coumadin, chronic alcohol abuse, chronic smoker and COPD admitted with abdominal pain and weakness.  Problem List  Past Medical History  Diagnosis Date  . COPD (chronic obstructive pulmonary disease)     Severe  . Hyperlipidemia   . Diastolic CHF   . Hypertension   . GERD (gastroesophageal reflux disease)   . Alcohol abuse   . Diastolic heart failure April 2013    Normal EF, mild AR, mild MR, moderate LAE, mod-severely RAE and severe TR  . Chronic anticoagulation     on Coumadin  . Atrial fibrillation   . Coronary artery disease     40-50% LCx by cath 2004    Past Surgical History  Procedure Laterality Date  . Transesophageal echocardiogram  06/18/99    no evidence of any clot in the L atruim or L atrial appendage & no mitral regurgiation/small central jet of aortic insufficiency  . Cardioversion  06/18/99    electrical atrical/ converted after single 200 watt-sec shock with AP paddles/ tolerated procedure well  . Breast surgery    . Cardiac catheterization  2004    EF 50%/L circumflex artery very lg vessel/proximal vessel is approx. 19mm/40-50% eccentric stenosis proximal aspect of vessel     Allergies  No Known Allergies  HPI   80 year old gentleman admitted with abdominal pain and weakness.  He has a history of alcohol-related neuropathies and a history of chronic atrial fibrillation on Coumadin.  He has been complaining of abdominal pain in the lower abdomen and dysuria for about 6 days and has had a poor appetite.  He denies any chest pain on admission.  He has noted some increased edema of his lower extremities recently.  He has a past history of diastolic heart failure.   Earlier this a.m. the patient was alert.  He subsequently had emesis of a large amount of food and apparently aspirated and had bradycardic arrest and CODE BLUE was called and he was intubated.  Inpatient Medications  . allopurinol  100 mg Oral Daily  . budesonide-formoterol  2 puff Inhalation BID  . fentaNYL  50 mcg Intravenous Once  . gabapentin  300 mg Oral QHS  . insulin aspart  0-9 Units Subcutaneous TID WC  . ipratropium-albuterol  3 mL Nebulization Q4H  . methylPREDNISolone (SOLU-MEDROL) injection  60 mg Intravenous 4 times per day  . metoprolol  50 mg Oral BID  . multivitamin with minerals  1 tablet Oral Daily  . ondansetron      . piperacillin-tazobactam (ZOSYN)  IV  2.25 g Intravenous 4 times per day  . sodium chloride  3 mL Intravenous Q12H  . [START ON 09/07/2013] vancomycin  1,000 mg Intravenous Q48H    Family History Family History  Problem Relation Age of Onset  . Diabetes Father   . Stroke Brother   . Cancer Daughter      Social History History   Social History  . Marital Status: Married    Spouse Name: N/A    Number of Children: N/A  . Years of Education: N/A   Occupational History  . retired    Social History Main Topics  . Smoking status: Former Smoker -- 2.00 packs/day for 40 years  Types: Cigarettes    Quit date: 03/09/1989  . Smokeless tobacco: Never Used  . Alcohol Use: 1.2 oz/week    2 Shots of liquor per week     Comment: 1/5 per week per pt  . Drug Use: No  . Sexual Activity: Not Currently   Other Topics Concern  . Not on file   Social History Narrative  . No narrative on file     Review of Systems  General:  No chills, fever, night sweats or weight changes.  Cardiovascular:  No chest pain, dyspnea on exertion, edema, orthopnea, palpitations, paroxysmal nocturnal dyspnea. Dermatological: No rash, lesions/masses Respiratory: No cough, dyspnea Urologic: No hematuria, dysuria Abdominal:   No nausea, vomiting, diarrhea, bright red  blood per rectum, melena, or hematemesis Neurologic:  No visual changes, wkns, changes in mental status. All other systems reviewed and are otherwise negative except as noted above.  Physical Exam  Blood pressure 201/90, pulse 125, temperature 98 F (36.7 C), temperature source Oral, resp. rate 20, height 5\' 8"  (1.727 m), weight 178 lb 9.2 oz (81 kg), SpO2 94.00%.  General: Pleasant, NAD earlier this a.m.  Now intubated. HEENT: Normal  Neck: Supple without bruits or JVD. Lungs:  Intubated.  Bilateral rhonchi Heart: Rapid irregular heart rate.  Grade 1/6 apical holosystolic murmur of mitral regurgitation. Abdomen: Distended.  No palpable masses Extremities: No clubbing, cyanosis or edema. DP/PT/Radials 2+ and equal bilaterally.  Labs   Recent Labs  12-13-13 0057  TROPONINI <0.30   Lab Results  Component Value Date   WBC 11.0* 04/12/13   HGB 12.7* 04/12/13   HCT 40.6 04/12/13   MCV 77.8* 04/12/13   PLT 367 04/12/13    Recent Labs Lab 12-13-13 0057  12-13-13 0734  NA 137  --  138  K 5.8*  < > 5.8*  CL 93*  --  95*  CO2 26  --  27  BUN 101*  --  101*  CREATININE 4.31*  --  4.29*  CALCIUM 9.5  --  9.2  PROT 7.3  --   --   BILITOT 0.2*  --   --   ALKPHOS 87  --   --   ALT 23  --   --   AST 36  --   --   GLUCOSE 111*  --  89  < > = values in this interval not displayed. Lab Results  Component Value Date   CHOL 143 04/06/2013   HDL 39.40 04/06/2013   LDLCALC 69 04/06/2013   TRIG 174.0* 04/06/2013   No results found for this basename: DDIMER    Radiology/Studies  Ct Abdomen Pelvis Wo Contrast  04/12/13   CLINICAL DATA:  Weakness with distention. Concern for ascites secondary to distended abdomen.  EXAM: CT ABDOMEN AND PELVIS WITHOUT CONTRAST  TECHNIQUE: Multidetector CT imaging of the abdomen and pelvis was performed following the standard protocol without IV contrast.  COMPARISON:  CT chest from 02/29/2012.  FINDINGS: Lung Bases: Right base collapse/  consolidation noted with small to moderate right pleural effusion. Stable appearance of calcified nodal tissue in the right hilum. The heart is enlarged. Coronary artery calcification is noted.  Liver: Liver it appear small and has a peripheral nodular contour, consistent with cirrhosis. 6 mm hypoattenuating lesion in the anterior segment of the right liver (image 27 series 2) is unchanged since the prior chest CT.  Spleen: Normal uninfused features.  No evidence for splenomegaly.  Stomach: Nondistended. No gastric wall thickening. No evidence  of outlet obstruction.  Pancreas: No focal mass lesion. No dilatation of the main duct. No intraparenchymal cyst. No peripancreatic edema.  Gallbladder/Biliary: No evidence for gallstones. No pericholecystic fluid. No intrahepatic or extrahepatic biliary dilation.  Kidneys/Adrenals: No adrenal nodule. No hydronephrosis in either kidney. No renal mass is evident on this uninfused exam.  Bowel Loops: Duodenum is normally positioned as is the ligament of Treitz. Mild small bowel distention throughout, measuring up to 3.0 cm in diameter. Small bowel loops are fluid-filled. Loops in the distal ileum or decompressed. The terminal ileum is decompressed but otherwise unremarkable. The appendix is not visualized, but there is no edema or inflammation in the region of the cecum. Diverticular changes are seen in the left colon without diverticulitis.  Nodes: No abdominal lymphadenopathy. No pelvic sidewall lymphadenopathy.  Vasculature: Atherosclerotic calcification is noted in the wall of the abdominal aorta without aneurysm.  Pelvic Genitourinary: Foley catheter decompresses the urinary bladder. Prostate gland is unremarkable.  Bones/Musculoskeletal: Bone windows reveal no worrisome lytic or sclerotic osseous lesions.  Body Wall: Small left inguinal hernia contains only fat.  Other: Moderate volume ascites is seen. Nodularity in the omentum may be related to edema.  IMPRESSION: Changes  in the liver compatible with cirrhosis. There is associated moderate to large volume ascites.  Proximal and mid small bowel is fluid filled and distended up to 3 cm in diameter. Imaging features may be related to ileus although the distal small bowel appears decompressed raising the question of a component of small bowel obstruction.  Right pleural effusion with right lower lobe collapse/consolidation.   Electronically Signed   By: Kennith Center M.D.   On: 10/29/2013 02:52   Dg Chest Port 1 View  2013-10-29   CLINICAL DATA:  Shortness of breath, abdominal pain and distention.  EXAM: PORTABLE CHEST - 1 VIEW  COMPARISON:  Chest radiograph performed 06/30/2011, and CT of the chest performed 02/29/2012  FINDINGS: The lungs are well-aerated. Mild bibasilar airspace opacities raise question for mild pneumonia, though minimal interstitial edema might have a similar appearance. A calcified granuloma is noted at the medial right lung base. No definite pleural effusion or pneumothorax is seen.  The cardiomediastinal silhouette is mildly enlarged. Mild vascular congestion is noted. No acute osseous abnormalities are seen.  IMPRESSION: 1. Mild bibasilar airspace opacities raise question for mild pneumonia, though minimal interstitial edema might have a similar appearance. 2. Mild vascular congestion and mild cardiomegaly noted.   Electronically Signed   By: Roanna Raider M.D.   On: 10-29-2013 02:53    ECG  Atrial fibrillation with rapid ventricular response Right bundle branch block Left anterior fascicular block Moderate voltage criteria for LVH, may be normal variant Possible Lateral infarct, age undetermined.  ASSESSMENT AND PLAN  1. Chronic diastolic congestive heart failure.  2. mitral regurgitation  3. COPD  4. permanent atrial fibrillation  5. 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 ischemic heart disease 7. status post cardiac arrest secondary to vomiting and aspiration 8.  bifascicular block 9.  Acute on chronic kidney disease  Plan: Supportive care at this point.  Appreciate CCM help  Signed, Cassell Clement, MD  10/29/13, 11:34 AM

## 2013-09-30 NOTE — Progress Notes (Signed)
RT placed Pt on BIPAP and after a couple minutes Pt rips mask off and stated that he isn't going to wear it.  RT placed Pt back on 2 LPM [place] and MD/RN notified.  RT to monitor and assess as needed.

## 2013-09-30 NOTE — Procedures (Signed)
Procedure Note  Procedure: CPR Operator: R Byrum  Details: Acute resp arrest following emesis and aspiration. Pt desaturated then experienced resp arrest, became bradycardic and then PEA for 5-7 minutes. CPR was stared immediately. [facility] intubation. Current status is intubated and ventilated, marginal BP with A fib and rates 120-130's. Will discuss the events with the patient's family.    Levy Pupaobert Byrum, MD, PhD 10/03/2013, 11:55 AM [facility] [phone] or if no answer [phone]

## 2013-09-30 NOTE — Progress Notes (Signed)
Patient vomited large amount of coffee ground emesis this am and mouth and oral pharynx suctioned quickly, but patient probably aspirated some vomitus.  HR decreased into the 30-40 range, and resp became agonal.  Dr. Delton CoombesByrum notified and patient intubated and CPR begun per protocol.  Patient was without a pulse for a short time, see code sheet, and patient placed on ventilator.  OG inserted per order and attached to  Low suction with coffee ground drainage returned.  Patient placed on a Fentanyl drip per order.  Continue to monitor patient closely.  Wilkin Lippy Debroah LoopArnold RN

## 2013-09-30 NOTE — ED Notes (Addendum)
Notified RN,Anna attempted lab draw x 2, but unsuccessful.

## 2013-09-30 NOTE — Progress Notes (Signed)
Attempted to call family at home, wife Rowan Blasenita Fry, to make her ware pt has been intubated. Message left to call me back.  Debbora PrestoMAGICK-MYERS, ISKRA, MD  [facility] Pager [phone]  If 7PM-7AM, please contact night-coverage [email or web address] Password [email or web address]

## 2013-09-30 NOTE — ED Provider Notes (Addendum)
Medical screening examination/treatment/procedure(s) were conducted as a shared visit with non-physician practitioner(s) or resident  and myself.  I personally evaluated the patient during the encounter and agree with the findings.   I have personally reviewed any xrays and/ or EKG's with the provider and I agree with interpretation.    Patient with known heart disease, atrial fibrillation, diastolic heart failure presents with shortness of breath, abdominal pain and distention. Patient feels these symptoms for gradually worsened the past one to 2 weeks. On exam patient has mild crackles at the bases, mild distress, tachypnea, hypoxia in the 60s without nasal cannula. Patient proves significantly with nasal K. oxygenation fortify liters however unable to tolerate BiPAP. Patient initially heart rate 140s however gradually improved with diltiazem bolus and drip.  Patient has significant swelling, mild distention abdominal exam, no focal pain or guarding, mild lower extremity swelling bilateral. Patient denies history of liver disease however is a chronic alcohol user. Ultrasound limited abdominal and limited transthoracic ultrasound (FAST)  Indication: abd distension Four views were obtained using the low frequency transducer: Splenorenal, Hepatorenal, Retrovesical  Interpretation:free fluid visualized surrounding the kidneys and pelvis likely ascites Images archived electronically Dr. Jodi MourningZavitz personally performed and interpreted the images    EMERGENCY DEPARTMENT US CARDIAC EXAM "Study: Limited Ultrasound of the heart and pericardium"  INDICATIONS:Hypotension dyspnea Multiple views of the heart and pericardium were obtained in real-time with a multi-frequency probe.  PERFORMED VF:IEPPIRBY:Myself  IMAGES ARCHIVED?: Yes  FINDINGS: No pericardial effusion, Decreased contractility, IVC normal and Tamponade physiology absent  LIMITATIONS:  Body habitus  VIEWS USED: Subcostal 4 chamber, Parasternal long  axis, Parasternal short axis and Apical 4 chamber   INTERPRETATION: Cardiac activity present, Pericardial effusioin absent, Cardiac tamponade absent and Decreased contractility Decreased LV EF    CRITICAL CARE Performed by: Enid SkeensZAVITZ, Sarita Hakanson M   Total critical care time: 30 min  Critical care time was exclusive of separately billable procedures and treating other patients.  Critical care was necessary to treat or prevent imminent or life-threatening deterioration.  Critical care was time spent personally by me on the following activities: development of treatment plan with patient and/or surrogate as well as nursing, discussions with consultants, evaluation of patient's response to treatment, examination of patient, obtaining history from patient or surrogate, ordering and performing treatments and interventions, ordering and review of laboratory studies, ordering and review of radiographic studies, pulse oximetry and re-evaluation of patient's condition.   Enid SkeensJoshua M Saphronia Ozdemir, MD 19-Apr-2013 [phone]  Enid SkeensJoshua M Tyshae Stair, MD 10/14/13 [phone]

## 2013-09-30 NOTE — ED Notes (Signed)
Bed: [id number] Expected date:  Expected time:  Means of arrival:  Comments: EMS 80yo M generalized weakness, recent prostate infection

## 2013-09-30 NOTE — Procedures (Signed)
Intubation Procedure Note Mariel CraftBobby G Marietta [id number] [birth year]  Procedure: Intubation Indications: Respiratory insufficiency  Procedure Details Consent: Unable to obtain consent because of emergent medical necessity. Time Out: Verified patient identification, verified procedure, site/side was marked, verified correct patient position, special equipment/implants available, medications/allergies/relevent history reviewed, required imaging and test results available.  Performed  Maximum sterile technique was used including gloves.  MAC and 3    Evaluation Hemodynamic Status: Transient hypertension requiring treatment; O2 sats: transiently fell during during procedure Patient's Current Condition: stable Complications: No apparent complications Patient did tolerate procedure well. Chest X-ray ordered to verify placement.  CXR: tube position acceptable.   Levy Pupaobert Brettney Ficken, MD, PhD 09/10/2013, 11:44 AM [facility] [phone] or if no answer [phone]

## 2013-09-30 NOTE — ED Provider Notes (Signed)
CSN: [id number]     Arrival date & time 09/07/2013  0003 History   First MD Initiated Contact with Patient 10/01/2013 0006     No chief complaint on file.   (Consider location/radiation/quality/duration/timing/severity/associated sxs/prior Treatment) HPI Comments: Patient is an 80 year old male with a history of diastolic CHF (EF 78-29% in 2013), mitral regurg, A fib on chronic coumadin, ischemic heart disease, CAD, HTN, COPD (former smoker), and a hx of alcohol abuse. He presents to the ED today for generalized weakness. Patient states he has been feeling worsening weakness x 2 weeks. He states he has been noncompliant with most of his medications since starting "an antibiotic" for a "prostate infection" diagnosed by his PCP 6 weeks ago. Patient states this antibiotic has made him nauseous at times, accounting for his noncompliance. Patient is a poor historian with respect to which medications he has been noncompliant with, but states he has not taken most of them for approximately 2 weeks. He states that, with his weakness, he has noticed associated chest pain, intermittent shortness of breath, and swelling in his legs. He also states he has been having worsening abdominal distention x 6 weeks which is accompanied by a "dull ache" worse with palpation. He denies associated fever, syncope, vomiting, and numbness/paresthesias. Patient states that his wife just got out of the hospital and he was spending much of his time with her. He states they live alone in a 15,000 sqft house.  Cardiologist - Dr. Patty Sermons PCP - Dr. Andrey Campanile at [facility]  The history is provided by the patient. No language interpreter was used.    Past Medical History  Diagnosis Date  . COPD (chronic obstructive pulmonary disease)     Severe  . Hyperlipidemia   . Diastolic CHF   . Hypertension   . GERD (gastroesophageal reflux disease)   . Alcohol abuse   . Diastolic heart failure April 2013    Normal EF, mild AR, mild MR,  moderate LAE, mod-severely RAE and severe TR  . Chronic anticoagulation     on Coumadin  . Atrial fibrillation   . Coronary artery disease     40-50% LCx by cath 2004   Past Surgical History  Procedure Laterality Date  . Transesophageal echocardiogram  06/18/99    no evidence of any clot in the L atruim or L atrial appendage & no mitral regurgiation/small central jet of aortic insufficiency  . Cardioversion  06/18/99    electrical atrical/ converted after single 200 watt-sec shock with AP paddles/ tolerated procedure well  . Breast surgery    . Cardiac catheterization  2004    EF 50%/L circumflex artery very lg vessel/proximal vessel is approx. 21mm/40-50% eccentric stenosis proximal aspect of vessel   Family History  Problem Relation Age of Onset  . Diabetes Father   . Stroke Brother   . Cancer Daughter    History  Substance Use Topics  . Smoking status: Former Smoker -- 2.00 packs/day for 40 years    Types: Cigarettes    Quit date: 03/09/1989  . Smokeless tobacco: Never Used  . Alcohol Use: 1.2 oz/week    2 Shots of liquor per week     Comment: 1/5 per week per pt    Review of Systems  Constitutional: Negative for fever.  Respiratory: Positive for shortness of breath.   Cardiovascular: Positive for chest pain.  Gastrointestinal: Positive for nausea and abdominal distention.  Neurological: Positive for weakness. Negative for syncope and numbness.      Allergies  Review of patient's allergies indicates no known allergies.  Home Medications   Prior to Admission medications   Medication Sig Start Date End Date Taking? Authorizing Provider  allopurinol (ZYLOPRIM) 100 MG tablet Take 100 mg by mouth daily. 11/16/12  Yes Cassell Clement, MD  atorvastatin (LIPITOR) 10 MG tablet Take 10 mg by mouth daily.   Yes Historical Provider, MD  budesonide-formoterol (SYMBICORT) 160-4.5 MCG/ACT inhaler Inhale 2 puffs into the lungs 2 (two) times daily.   Yes Historical Provider, MD   furosemide (LASIX) 40 MG tablet Take 40 mg by mouth daily.   Yes Historical Provider, MD  gabapentin (NEURONTIN) 300 MG capsule Take 1 capsule (300 mg total) by mouth at bedtime. 07/18/13  Yes Cassell Clement, MD  metoprolol (LOPRESSOR) 50 MG tablet Take 50 mg by mouth 2 (two) times daily.   Yes Historical Provider, MD  Multiple Vitamin (MULTIVITAMIN WITH MINERALS) TABS tablet Take 1 tablet by mouth daily.   Yes Historical Provider, MD  nitroGLYCERIN (NITROSTAT) 0.4 MG SL tablet Place 0.4 mg under the tongue every 5 (five) minutes as needed for chest pain.   Yes Historical Provider, MD  tiotropium (SPIRIVA) 18 MCG inhalation capsule Place 18 mcg into inhaler and inhale daily.   Yes Historical Provider, MD  warfarin (COUMADIN) 2.5 MG tablet Take 2.5 mg by mouth daily.   Yes Historical Provider, MD   BP 132/69  Pulse 77  Temp(Src) 98.7 F (37.1 C) (Rectal)  Resp 23  Ht 5\' 7"  (1.702 m)  Wt 199 lb 4.7 oz (90.399 kg)  BMI 31.21 kg/m2  SpO2 98%  Physical Exam  Nursing note and vitals reviewed. Constitutional: He is oriented to person, place, and time. He appears well-developed and well-nourished. No distress.  Nontoxic appearing. Pleasant with sense of humor.  HENT:  Head: Normocephalic and atraumatic.  Eyes: Conjunctivae and EOM are normal. No scleral icterus.  Neck: Normal range of motion.  Cardiovascular: An irregularly irregular rhythm present. Tachycardia present.   Sporadic irregularly irregular rhythm with rapid rate. Otherwise, regular tachycardia.  Pulmonary/Chest: Effort normal. No respiratory distress. He has no wheezes. He has no rales.  Mild decreased BS in b/l bases. No crackles or rhonchi.  Abdominal: He exhibits distension. He exhibits no mass. There is tenderness. There is no rebound and no guarding.  Significantly distended abdomen with diffuse TTP. No masses or peritoneal signs.  Musculoskeletal: Normal range of motion. He exhibits edema.  2+ pitting b/l lower  extremities  Neurological: He is alert and oriented to person, place, and time. He exhibits normal muscle tone. Coordination normal.  GCS 15. Speech is goal oriented.  Skin: Skin is warm and dry. No rash noted. He is not diaphoretic. No erythema. No pallor.  Psychiatric: He has a normal mood and affect. His behavior is normal.    ED Course  Procedures (including critical care time) Labs Review Labs Reviewed  CBC WITH DIFFERENTIAL - Abnormal; Notable for the following:    WBC 11.0 (*)    Hemoglobin 12.7 (*)    MCV 77.8 (*)    MCH 24.3 (*)    RDW 16.5 (*)    Neutrophils Relative % 86 (*)    Neutro Abs 9.5 (*)    Lymphocytes Relative 6 (*)    Lymphs Abs 0.6 (*)    All other components within normal limits  COMPREHENSIVE METABOLIC PANEL - Abnormal; Notable for the following:    Potassium 5.8 (*)    Chloride 93 (*)    Glucose, Bld  111 (*)    BUN 101 (*)    Creatinine, Ser 4.31 (*)    Albumin 2.5 (*)    Total Bilirubin 0.2 (*)    GFR calc non Af Amer 12 (*)    GFR calc Af Amer 13 (*)    Anion gap 18 (*)    All other components within normal limits  PRO B NATRIURETIC PEPTIDE - Abnormal; Notable for the following:    Pro B Natriuretic peptide (BNP) 18240.0 (*)    All other components within normal limits  URINALYSIS, ROUTINE W REFLEX MICROSCOPIC - Abnormal; Notable for the following:    APPearance CLOUDY (*)    Hgb urine dipstick LARGE (*)    All other components within normal limits  PROTIME-INR - Abnormal; Notable for the following:    Prothrombin Time 24.1 (*)    INR 2.16 (*)    All other components within normal limits  APTT - Abnormal; Notable for the following:    aPTT 45 (*)    All other components within normal limits  BLOOD GAS, VENOUS - Abnormal; Notable for the following:    pH, Ven 7.326 (*)    pCO2, Ven 51.7 (*)    pO2, Ven 46.1 (*)    Bicarbonate 26.2 (*)    All other components within normal limits  CULTURE, BLOOD (ROUTINE X 2)  CULTURE, BLOOD (ROUTINE X  2)  URINE CULTURE  TROPONIN I  URINE MICROSCOPIC-ADD ON  POTASSIUM  MAGNESIUM  I-STAT CG4 LACTIC ACID, ED   Imaging Review Ct Abdomen Pelvis Wo Contrast  09/07/2013   CLINICAL DATA:  Weakness with distention. Concern for ascites secondary to distended abdomen.  EXAM: CT ABDOMEN AND PELVIS WITHOUT CONTRAST  TECHNIQUE: Multidetector CT imaging of the abdomen and pelvis was performed following the standard protocol without IV contrast.  COMPARISON:  CT chest from 02/29/2012.  FINDINGS: Lung Bases: Right base collapse/ consolidation noted with small to moderate right pleural effusion. Stable appearance of calcified nodal tissue in the right hilum. The heart is enlarged. Coronary artery calcification is noted.  Liver: Liver it appear small and has a peripheral nodular contour, consistent with cirrhosis. 6 mm hypoattenuating lesion in the anterior segment of the right liver (image 27 series 2) is unchanged since the prior chest CT.  Spleen: Normal uninfused features.  No evidence for splenomegaly.  Stomach: Nondistended. No gastric wall thickening. No evidence of outlet obstruction.  Pancreas: No focal mass lesion. No dilatation of the main duct. No intraparenchymal cyst. No peripancreatic edema.  Gallbladder/Biliary: No evidence for gallstones. No pericholecystic fluid. No intrahepatic or extrahepatic biliary dilation.  Kidneys/Adrenals: No adrenal nodule. No hydronephrosis in either kidney. No renal mass is evident on this uninfused exam.  Bowel Loops: Duodenum is normally positioned as is the ligament of Treitz. Mild small bowel distention throughout, measuring up to 3.0 cm in diameter. Small bowel loops are fluid-filled. Loops in the distal ileum or decompressed. The terminal ileum is decompressed but otherwise unremarkable. The appendix is not visualized, but there is no edema or inflammation in the region of the cecum. Diverticular changes are seen in the left colon without diverticulitis.  Nodes: No  abdominal lymphadenopathy. No pelvic sidewall lymphadenopathy.  Vasculature: Atherosclerotic calcification is noted in the wall of the abdominal aorta without aneurysm.  Pelvic Genitourinary: Foley catheter decompresses the urinary bladder. Prostate gland is unremarkable.  Bones/Musculoskeletal: Bone windows reveal no worrisome lytic or sclerotic osseous lesions.  Body Wall: Small left inguinal hernia contains only fat.  Other: Moderate volume ascites is seen. Nodularity in the omentum may be related to edema.  IMPRESSION: Changes in the liver compatible with cirrhosis. There is associated moderate to large volume ascites.  Proximal and mid small bowel is fluid filled and distended up to 3 cm in diameter. Imaging features may be related to ileus although the distal small bowel appears decompressed raising the question of a component of small bowel obstruction.  Right pleural effusion with right lower lobe collapse/consolidation.   Electronically Signed   By: Kennith CenterEric  Mansell M.D.   On: 09/08/2013 02:52   Dg Chest Port 1 View  09/14/2013   CLINICAL DATA:  Shortness of breath, abdominal pain and distention.  EXAM: PORTABLE CHEST - 1 VIEW  COMPARISON:  Chest radiograph performed 06/30/2011, and CT of the chest performed 02/29/2012  FINDINGS: The lungs are well-aerated. Mild bibasilar airspace opacities raise question for mild pneumonia, though minimal interstitial edema might have a similar appearance. A calcified granuloma is noted at the medial right lung base. No definite pleural effusion or pneumothorax is seen.  The cardiomediastinal silhouette is mildly enlarged. Mild vascular congestion is noted. No acute osseous abnormalities are seen.  IMPRESSION: 1. Mild bibasilar airspace opacities raise question for mild pneumonia, though minimal interstitial edema might have a similar appearance. 2. Mild vascular congestion and mild cardiomegaly noted.   Electronically Signed   By: Roanna RaiderJeffery  Chang M.D.   On: 10/03/2013  02:53     EKG Interpretation   Date/Time:  Saturday September 30 2013 00:33:20 EDT Ventricular Rate:  149 PR Interval:    QRS Duration: 154 QT Interval:  357 QTC Calculation: 562 R Axis:   -72 Text Interpretation:  Extreme tachycardia with wide complex, no further  rhythm analysis similar morphology to previous Confirmed by Gwendolyn GrantWALDEN  MD,  BLAIR ([id number]) on 09/13/2013 12:36:17 AM      CRITICAL CARE Performed by: Antony MaduraHUMES, Anastassia Noack   Total critical care time: 45  Critical care time was exclusive of separately billable procedures and treating other patients.  Critical care was necessary to treat or prevent imminent or life-threatening deterioration.  Critical care was time spent personally by me on the following activities: development of treatment plan with patient and/or surrogate as well as nursing, discussions with consultants, evaluation of patient's response to treatment, examination of patient, obtaining history from patient or surrogate, ordering and performing treatments and interventions, ordering and review of laboratory studies, ordering and review of radiographic studies, pulse oximetry and re-evaluation of patient's condition.   MDM   Final diagnoses:  Atrial fibrillation with rapid ventricular response  Hypoxia  Cirrhosis of liver with ascites, unspecified hepatic cirrhosis type  Acute renal failure, unspecified acute renal failure type  Hyperkalemia  Elevated brain natriuretic peptide (BNP) level    80 year old male with an extensive cardiac history, including A. fib on chronic coumadin, and history of alcohol abuse presents to the emergency department today for weakness x2 weeks. Patient found to be in wide complex tachycardia consistent with rapid A. fib with RVR. Patient also complained of worsening abdominal distention x6 weeks and bilateral lower leg swelling. Distention associated with aching, generalized discomfort. CT abdomen and pelvis obtained which shows liver  changes compatible with cirrhosis as well as associated moderate to large volume ascites. Patient less likely in heart failure. BNP thought to be secondary to liver cirrhosis. CXR only showing mild vascular congestion. Troponin negative. A fib controlled well with cardizem in ED. INR therapeutic.  Workup today also significant for acute  renal failure with increased creatinine from 1.2, 5 months ago, to 4.39. Hyperkalemia addressed with calcium gluconate, insulin, and dextrose. Have consulted with cardiology who will evaluate patient in AM given hx of diastolic CHF. Have also consulted with general surgery given CT findings of questionable SBO. Dr. Gerrit Friends doubts SBO given CT findings, but CCS to consult in AM. Case discussed with Dr. Gerri Lins. [facility] admit to [facility].   Filed Vitals:   10/01/2013 0330 09/18/2013 0345 09/16/2013 0400 10/01/2013 0415  BP: 138/75 136/67 121/73 147/91  Pulse: 58 58 59   Temp:      TempSrc:      Resp: 25 30 23  33  Height:      Weight:      SpO2: 97% 99% 96%        Antony Madura, PA-C 09/24/2013 [phone]

## 2013-09-30 NOTE — Consult Note (Signed)
Reason for Consult: abdominal pain, rule out small bowel obstruction  Referring Physician: Reather Converse EDP  Scott Reese is an 80 y.o. male.   HPI: patient presented to the emergency department last night complaining of progressive weakness and lower abdominal pain. He has multiple chronic medical problems as detailed below and was admitted by the hospitalist service. Concern was raised over possible small bowel obstruction based on CT scan. On questioning the patient indicates pain in his perineum rather than his abdomen. He states he has been having problems and pain urinating. He has abdominal distention. He has been nauseated but no vomiting. On careful questioning really denies pain in his abdomen but again has discomfort more in his perineum. He has no history of abdominal surgery.  Past Medical History  Diagnosis Date  . COPD (chronic obstructive pulmonary disease)     Severe  . Hyperlipidemia   . Diastolic CHF   . Hypertension   . GERD (gastroesophageal reflux disease)   . Alcohol abuse   . Diastolic heart failure April 2013    Normal EF, mild AR, mild MR, moderate LAE, mod-severely RAE and severe TR  . Chronic anticoagulation     on Coumadin  . Atrial fibrillation   . Coronary artery disease     40-50% LCx by cath 2004    Past Surgical History  Procedure Laterality Date  . Transesophageal echocardiogram  06/18/99    no evidence of any clot in the L atruim or L atrial appendage & no mitral regurgiation/small central jet of aortic insufficiency  . Cardioversion  06/18/99    electrical atrical/ converted after single 200 watt-sec shock with AP paddles/ tolerated procedure well  . Breast surgery    . Cardiac catheterization  2004    EF 50%/L circumflex artery very lg vessel/proximal vessel is approx. 56m/40-50% eccentric stenosis proximal aspect of vessel    Family History  Problem Relation Age of Onset  . Diabetes Father   . Stroke Brother   . Cancer Daughter     Social  History:  reports that he quit smoking about 24 years ago. His smoking use included Cigarettes. He has a 80 pack-year smoking history. He has never used smokeless tobacco. He reports that he drinks about 1.2 ounces of alcohol per week. He reports that he does not use illicit drugs.  Allergies: No Known Allergies  Current Facility-Administered Medications  Medication Dose Route Frequency Provider Last Rate Last Dose  . allopurinol (ZYLOPRIM) tablet 100 mg  100 mg Oral Daily Ava Swayze, DO      . budesonide-formoterol (SYMBICORT) 160-4.5 MCG/ACT inhaler 2 puff  2 puff Inhalation BID Ava Swayze, DO   2 puff at 10/01/2013 0935  . diltiazem (CARDIZEM) 100 mg in dextrose 5 % 100 mL infusion  5-15 mg/hr Intravenous Titrated MRitta Slot NP 5 mL/hr at 09/11/2013 0600 5 mg/hr at 09/17/2013 0600  . gabapentin (NEURONTIN) capsule 300 mg  300 mg Oral QHS Ava Swayze, DO      . insulin aspart (novoLOG) injection 0-9 Units  0-9 Units Subcutaneous TID WC Ava Swayze, DO      . ipratropium-albuterol (DUONEB) 0.5-2.5 (3) MG/3ML nebulizer solution 3 mL  3 mL Nebulization Q4H Ava Swayze, DO   3 mL at 09/23/2013 0935  . methylPREDNISolone sodium succinate (SOLU-MEDROL) 125 mg/2 mL injection 60 mg  60 mg Intravenous 4 times per day Ava Swayze, DO   60 mg at 09/08/2013 0659  . metoprolol tartrate (LOPRESSOR) tablet 50 mg  50 mg Oral BID Ava Swayze, DO      . morphine 2 MG/ML injection 1 mg  1 mg Intravenous Q4H PRN Ava Swayze, DO   1 mg at 10/05/2013 0832  . multivitamin with minerals tablet 1 tablet  1 tablet Oral Daily Ava Swayze, DO      . nitroGLYCERIN (NITROSTAT) SL tablet 0.4 mg  0.4 mg Sublingual Q5 min PRN Ava Swayze, DO      . ondansetron (ZOFRAN) injection 4 mg  4 mg Intravenous Q6H PRN Theodis Blaze, MD   4 mg at 10/03/2013 0940  . oxyCODONE (Oxy IR/ROXICODONE) immediate release tablet 5 mg  5 mg Oral Q4H PRN Ava Swayze, DO      . piperacillin-tazobactam (ZOSYN) IVPB 2.25 g  2.25 g Intravenous 4 times per day Ava Swayze,  DO      . sodium chloride 0.9 % injection 3 mL  3 mL Intravenous Q12H Ava Swayze, DO      . sodium polystyrene (KAYEXALATE) 15 GM/60ML suspension 15 g  15 g Oral Once Theodis Blaze, MD      . Derrill Memo ON 04-Oct-2013] vancomycin (VANCOCIN) IVPB 1000 mg/200 mL premix  1,000 mg Intravenous Q48H Ava Swayze, DO         Results for orders placed during the hospital encounter of 09/12/2013 (from the past 48 hour(s))  CBC WITH DIFFERENTIAL     Status: Abnormal   Collection Time    09/11/2013 12:57 AM      Result Value Ref Range   WBC 11.0 (*) 4.0 - 10.5 K/uL   RBC 5.22  4.22 - 5.81 MIL/uL   Hemoglobin 12.7 (*) 13.0 - 17.0 g/dL   HCT 40.6  39.0 - 52.0 %   MCV 77.8 (*) 78.0 - 100.0 fL   MCH 24.3 (*) 26.0 - 34.0 pg   MCHC 31.3  30.0 - 36.0 g/dL   RDW 16.5 (*) 11.5 - 15.5 %   Platelets 367  150 - 400 K/uL   Neutrophils Relative % 86 (*) 43 - 77 %   Neutro Abs 9.5 (*) 1.7 - 7.7 K/uL   Lymphocytes Relative 6 (*) 12 - 46 %   Lymphs Abs 0.6 (*) 0.7 - 4.0 K/uL   Monocytes Relative 8  3 - 12 %   Monocytes Absolute 0.9  0.1 - 1.0 K/uL   Eosinophils Relative 0  0 - 5 %   Eosinophils Absolute 0.0  0.0 - 0.7 K/uL   Basophils Relative 0  0 - 1 %   Basophils Absolute 0.0  0.0 - 0.1 K/uL  COMPREHENSIVE METABOLIC PANEL     Status: Abnormal   Collection Time    09/14/2013 12:57 AM      Result Value Ref Range   Sodium 137  137 - 147 mEq/L   Potassium 5.8 (*) 3.7 - 5.3 mEq/L   Chloride 93 (*) 96 - 112 mEq/L   CO2 26  19 - 32 mEq/L   Glucose, Bld 111 (*) 70 - 99 mg/dL   BUN 101 (*) 6 - 23 mg/dL   Creatinine, Ser 4.31 (*) 0.50 - 1.35 mg/dL   Calcium 9.5  8.4 - 10.5 mg/dL   Total Protein 7.3  6.0 - 8.3 g/dL   Albumin 2.5 (*) 3.5 - 5.2 g/dL   AST 36  0 - 37 U/L   ALT 23  0 - 53 U/L   Alkaline Phosphatase 87  39 - 117 U/L   Total Bilirubin 0.2 (*)  0.3 - 1.2 mg/dL   GFR calc non Af Amer 12 (*) >90 mL/min   GFR calc Af Amer 13 (*) >90 mL/min   Comment: (NOTE)     The eGFR has been calculated using the CKD EPI  equation.     This calculation has not been validated in all clinical situations.     eGFR's persistently <90 mL/min signify possible Chronic Kidney     Disease.   Anion gap 18 (*) 5 - 15  TROPONIN I     Status: None   Collection Time    09/28/2013 12:57 AM      Result Value Ref Range   Troponin I <0.30  <0.30 ng/mL   Comment:            Due to the release kinetics of cTnI,     a negative result within the first hours     of the onset of symptoms does not rule out     myocardial infarction with certainty.     If myocardial infarction is still suspected,     repeat the test at appropriate intervals.  PROTIME-INR     Status: Abnormal   Collection Time    09/24/2013 12:57 AM      Result Value Ref Range   Prothrombin Time 24.1 (*) 11.6 - 15.2 seconds   INR 2.16 (*) 0.00 - 1.49  APTT     Status: Abnormal   Collection Time    09/24/2013 12:57 AM      Result Value Ref Range   aPTT 45 (*) 24 - 37 seconds   Comment:            IF BASELINE aPTT IS ELEVATED,     SUGGEST PATIENT RISK ASSESSMENT     BE USED TO DETERMINE APPROPRIATE     ANTICOAGULANT THERAPY.  PRO B NATRIURETIC PEPTIDE     Status: Abnormal   Collection Time    09/19/2013 12:58 AM      Result Value Ref Range   Pro B Natriuretic peptide (BNP) 18240.0 (*) 0 - 450 pg/mL  URINALYSIS, ROUTINE W REFLEX MICROSCOPIC     Status: Abnormal   Collection Time    09/13/2013  1:14 AM      Result Value Ref Range   Color, Urine YELLOW  YELLOW   APPearance CLOUDY (*) CLEAR   Specific Gravity, Urine 1.018  1.005 - 1.030   pH 5.0  5.0 - 8.0   Glucose, UA NEGATIVE  NEGATIVE mg/dL   Hgb urine dipstick LARGE (*) NEGATIVE   Bilirubin Urine NEGATIVE  NEGATIVE   Ketones, ur NEGATIVE  NEGATIVE mg/dL   Protein, ur NEGATIVE  NEGATIVE mg/dL   Urobilinogen, UA 0.2  0.0 - 1.0 mg/dL   Nitrite NEGATIVE  NEGATIVE   Leukocytes, UA NEGATIVE  NEGATIVE  I-STAT CG4 LACTIC ACID, ED     Status: None   Collection Time    09/13/2013  1:14 AM      Result Value Ref  Range   Lactic Acid, Venous 1.65  0.5 - 2.2 mmol/L  URINE MICROSCOPIC-ADD ON     Status: None   Collection Time    09/11/2013  1:14 AM      Result Value Ref Range   RBC / HPF 21-50  <3 RBC/hpf   Urine-Other AMORPHOUS URATES/PHOSPHATES    BLOOD GAS, VENOUS     Status: Abnormal   Collection Time    09/16/2013  2:13 AM  Result Value Ref Range   O2 Content 3.0     Delivery systems NASAL CANNULA     pH, Ven 7.326 (*) 7.250 - 7.300   pCO2, Ven 51.7 (*) 45.0 - 50.0 mmHg   pO2, Ven 46.1 (*) 30.0 - 45.0 mmHg   Bicarbonate 26.2 (*) 20.0 - 24.0 mEq/L   TCO2 23.7  0 - 100 mmol/L   Acid-base deficit 0.0  0.0 - 2.0 mmol/L   O2 Saturation 73.3     Patient temperature 98.6     Collection site VEIN     Drawn by COLLECTED BY NURSE     Sample type VENOUS    POTASSIUM     Status: Abnormal   Collection Time    09/27/2013  4:31 AM      Result Value Ref Range   Potassium 5.7 (*) 3.7 - 5.3 mEq/L  MAGNESIUM     Status: Abnormal   Collection Time    09/28/2013  4:31 AM      Result Value Ref Range   Magnesium 3.5 (*) 1.5 - 2.5 mg/dL  MRSA PCR SCREENING     Status: None   Collection Time    09/29/2013  5:46 AM      Result Value Ref Range   MRSA by PCR NEGATIVE  NEGATIVE   Comment:            The GeneXpert MRSA Assay (FDA     approved for NASAL specimens     only), is one component of a     comprehensive MRSA colonization     surveillance program. It is not     intended to diagnose MRSA     infection nor to guide or     monitor treatment for     MRSA infections.  BASIC METABOLIC PANEL     Status: Abnormal   Collection Time    09/21/2013  7:34 AM      Result Value Ref Range   Sodium 138  137 - 147 mEq/L   Potassium 5.8 (*) 3.7 - 5.3 mEq/L   Chloride 95 (*) 96 - 112 mEq/L   CO2 27  19 - 32 mEq/L   Glucose, Bld 89  70 - 99 mg/dL   BUN 101 (*) 6 - 23 mg/dL   Creatinine, Ser 4.29 (*) 0.50 - 1.35 mg/dL   Calcium 9.2  8.4 - 10.5 mg/dL   GFR calc non Af Amer 12 (*) >90 mL/min   GFR calc Af Amer 13  (*) >90 mL/min   Comment: (NOTE)     The eGFR has been calculated using the CKD EPI equation.     This calculation has not been validated in all clinical situations.     eGFR's persistently <90 mL/min signify possible Chronic Kidney     Disease.   Anion gap 16 (*) 5 - 15  GLUCOSE, CAPILLARY     Status: None   Collection Time    09/29/2013  7:42 AM      Result Value Ref Range   Glucose-Capillary 93  70 - 99 mg/dL   Comment 1 Documented in Chart     Comment 2 Notify RN      Ct Abdomen Pelvis Wo Contrast  09/18/2013   CLINICAL DATA:  Weakness with distention. Concern for ascites secondary to distended abdomen.  EXAM: CT ABDOMEN AND PELVIS WITHOUT CONTRAST  TECHNIQUE: Multidetector CT imaging of the abdomen and pelvis was performed following the standard protocol without IV contrast.  COMPARISON:  CT chest from 02/29/2012.  FINDINGS: Lung Bases: Right base collapse/ consolidation noted with small to moderate right pleural effusion. Stable appearance of calcified nodal tissue in the right hilum. The heart is enlarged. Coronary artery calcification is noted.  Liver: Liver it appear small and has a peripheral nodular contour, consistent with cirrhosis. 6 mm hypoattenuating lesion in the anterior segment of the right liver (image 27 series 2) is unchanged since the prior chest CT.  Spleen: Normal uninfused features.  No evidence for splenomegaly.  Stomach: Nondistended. No gastric wall thickening. No evidence of outlet obstruction.  Pancreas: No focal mass lesion. No dilatation of the main duct. No intraparenchymal cyst. No peripancreatic edema.  Gallbladder/Biliary: No evidence for gallstones. No pericholecystic fluid. No intrahepatic or extrahepatic biliary dilation.  Kidneys/Adrenals: No adrenal nodule. No hydronephrosis in either kidney. No renal mass is evident on this uninfused exam.  Bowel Loops: Duodenum is normally positioned as is the ligament of Treitz. Mild small bowel distention throughout,  measuring up to 3.0 cm in diameter. Small bowel loops are fluid-filled. Loops in the distal ileum or decompressed. The terminal ileum is decompressed but otherwise unremarkable. The appendix is not visualized, but there is no edema or inflammation in the region of the cecum. Diverticular changes are seen in the left colon without diverticulitis.  Nodes: No abdominal lymphadenopathy. No pelvic sidewall lymphadenopathy.  Vasculature: Atherosclerotic calcification is noted in the wall of the abdominal aorta without aneurysm.  Pelvic Genitourinary: Foley catheter decompresses the urinary bladder. Prostate gland is unremarkable.  Bones/Musculoskeletal: Bone windows reveal no worrisome lytic or sclerotic osseous lesions.  Body Wall: Small left inguinal hernia contains only fat.  Other: Moderate volume ascites is seen. Nodularity in the omentum may be related to edema.  IMPRESSION: Changes in the liver compatible with cirrhosis. There is associated moderate to large volume ascites.  Proximal and mid small bowel is fluid filled and distended up to 3 cm in diameter. Imaging features may be related to ileus although the distal small bowel appears decompressed raising the question of a component of small bowel obstruction.  Right pleural effusion with right lower lobe collapse/consolidation.   Electronically Signed   By: Misty Stanley M.D.   On: 09/25/2013 02:52   Dg Chest Port 1 View  09/29/2013   CLINICAL DATA:  Shortness of breath, abdominal pain and distention.  EXAM: PORTABLE CHEST - 1 VIEW  COMPARISON:  Chest radiograph performed 06/30/2011, and CT of the chest performed 02/29/2012  FINDINGS: The lungs are well-aerated. Mild bibasilar airspace opacities raise question for mild pneumonia, though minimal interstitial edema might have a similar appearance. A calcified granuloma is noted at the medial right lung base. No definite pleural effusion or pneumothorax is seen.  The cardiomediastinal silhouette is mildly  enlarged. Mild vascular congestion is noted. No acute osseous abnormalities are seen.  IMPRESSION: 1. Mild bibasilar airspace opacities raise question for mild pneumonia, though minimal interstitial edema might have a similar appearance. 2. Mild vascular congestion and mild cardiomegaly noted.   Electronically Signed   By: Garald Balding M.D.   On: 09/18/2013 02:53    Review of Systems  Constitutional: Positive for malaise/fatigue. Negative for fever and chills.  Respiratory: Positive for shortness of breath.   Cardiovascular: Positive for leg swelling. Negative for chest pain.  Gastrointestinal: Positive for nausea, abdominal pain and constipation. Negative for vomiting and diarrhea.  Genitourinary: Positive for dysuria.   Blood pressure 111/47, pulse 59, temperature 98 F (36.7 C), temperature  source Oral, resp. rate 34, height _0  (1.727 m), weight 178 lb 9.2 oz (81 kg), SpO2 100.00%. Physical Exam General: Chronically ill-appearing white male, alert and responsive Skin: Multiple bruises over extremities HEENT: No palpable masses. Sclera nonicteric Lymph nodes: No cervical, supraclavicular nodes palpable Lungs: Somewhat poor respiratory effort. Mild wheezing. Decreased breath sounds at the bases. Cardiac: 1-2+ lower extremity edema. Regular rate and rhythm. Abdomen: Very distended with fluid wave. Nontender. No hernias. No appreciable masses. GU: Somewhat atrophic. Foley in place. Extremities: 1-2+ lower extremity edema. Neurologic: Alert and oriented to person place and situation. No gross motor deficits  Assessment/Plan: Patient with multiple medical problems including cirrhosis and ascites, A. Fib with rapid ventricular rate, acute renal failure and urinary retention with possible acute prostatitis. On questioning the pain is having really seems to be more in his perineum and his abdomen and seems consistent with possible prostatitis. Most all of his distention based on our review  the CT scan is secondary to ascites. He has mildly dilated fluid filled loops of bowel with some relative decompression distally but the entire picture seems much more consistent with ileus than mechanical bowel obstruction. He has no vomiting. No history of previous abdominal surgery. He certainly has multiple reasons for adynamic ileus. At this point I do not believe he has a surgical problem. We will not plan to follow, please call as needed.  Ahmyah Gidley T 09/09/2013, 9:36 AM

## 2013-09-30 NOTE — Progress Notes (Signed)
eLink Physician-Brief Progress Note Patient Name: Scott CraftBobby G Reese DOB: 09/25/1927 MRN: [id number]  Date of Service  09/12/2013   HPI/Events of Note   Poor UOP.  Creat 4.59 and BUN greater than 100. Normotensive.  Edema on cxr agitation  eICU Interventions  Follow UOP.   Versed for agitation   Intervention Category Intermediate Interventions: Oliguria - evaluation and management  Henry RusselSMITH, Hiroyuki Ozanich, P 09/18/2013, 8:22 PM

## 2013-09-30 NOTE — ED Notes (Signed)
Humes, K. PA made aware of patient CG4 Lactic results.

## 2013-10-01 ENCOUNTER — Inpatient Hospital Stay (HOSPITAL_COMMUNITY): Payer: Medicare Other

## 2013-10-01 LAB — BASIC METABOLIC PANEL
Anion gap: 18 — ABNORMAL HIGH (ref 5–15)
Anion gap: 18 — ABNORMAL HIGH (ref 5–15)
BUN: 116 mg/dL — ABNORMAL HIGH (ref 6–23)
BUN: 116 mg/dL — ABNORMAL HIGH (ref 6–23)
CALCIUM: 8.7 mg/dL (ref 8.4–10.5)
CO2: 23 mEq/L (ref 19–32)
CO2: 22 mEq/L (ref 19–32)
Calcium: 8.8 mg/dL (ref 8.4–10.5)
Chloride: 98 mEq/L (ref 96–112)
Chloride: 100 mEq/L (ref 96–112)
Creatinine, Ser: 5.29 mg/dL — ABNORMAL HIGH (ref 0.50–1.35)
Creatinine, Ser: 5.48 mg/dL — ABNORMAL HIGH (ref 0.50–1.35)
GFR calc Af Amer: 10 mL/min — ABNORMAL LOW (ref >90)
GFR calc non Af Amer: 9 mL/min — ABNORMAL LOW (ref >90)
GFR calc non Af Amer: 9 mL/min — ABNORMAL LOW (ref >90)
GFR, EST AFRICAN AMERICAN: 10 mL/min — AB (ref >90)
GLUCOSE: 144 mg/dL — AB (ref 70–99)
Glucose, Bld: 124 mg/dL — ABNORMAL HIGH (ref 70–99)
POTASSIUM: 6.2 meq/L — AB (ref 3.7–5.3)
Potassium: 6.4 mEq/L — ABNORMAL HIGH (ref 3.7–5.3)
SODIUM: 139 meq/L (ref 137–147)
SODIUM: 140 meq/L (ref 137–147)

## 2013-10-01 LAB — BLOOD GAS, ARTERIAL
Acid-base deficit: 3.7 mmol/L — ABNORMAL HIGH (ref 0.0–2.0)
Bicarbonate: 23.4 mEq/L (ref 20.0–24.0)
DRAWN BY: 11249
FIO2: 0.5 %
LHR: 14 {breaths}/min
O2 Saturation: 96.5 %
PEEP/CPAP: 5 cmH2O
PH ART: 7.255 — AB (ref 7.350–7.450)
Patient temperature: 98.6
TCO2: 22.1 mmol/L (ref 0–100)
VT: 600 mL
pCO2 arterial: 54.7 mmHg — ABNORMAL HIGH (ref 35.0–45.0)
pO2, Arterial: 97.5 mmHg (ref 80.0–100.0)

## 2013-10-01 LAB — HEPATIC FUNCTION PANEL
ALBUMIN: 2 g/dL — AB (ref 3.5–5.2)
ALK PHOS: 71 U/L (ref 39–117)
ALT: 19 U/L (ref 0–53)
AST: 31 U/L (ref 0–37)
Bilirubin, Direct: 0.2 mg/dL (ref 0.0–0.3)
Indirect Bilirubin: 0.1 mg/dL — ABNORMAL LOW (ref 0.3–0.9)
TOTAL PROTEIN: 6.2 g/dL (ref 6.0–8.3)
Total Bilirubin: 0.3 mg/dL (ref 0.3–1.2)

## 2013-10-01 LAB — GLUCOSE, CAPILLARY
GLUCOSE-CAPILLARY: 156 mg/dL — AB (ref 70–99)
GLUCOSE-CAPILLARY: 119 mg/dL — AB (ref 70–99)
Glucose-Capillary: 108 mg/dL — ABNORMAL HIGH (ref 70–99)
Glucose-Capillary: 112 mg/dL — ABNORMAL HIGH (ref 70–99)

## 2013-10-01 LAB — URINE CULTURE
COLONY COUNT: NO GROWTH
Culture: NO GROWTH

## 2013-10-01 LAB — CBC
HCT: 36.9 % — ABNORMAL LOW (ref 39.0–52.0)
Hemoglobin: 11.2 g/dL — ABNORMAL LOW (ref 13.0–17.0)
MCH: 24.3 pg — AB (ref 26.0–34.0)
MCHC: 30.4 g/dL (ref 30.0–36.0)
MCV: 80 fL (ref 78.0–100.0)
PLATELETS: 276 10*3/uL (ref 150–400)
RBC: 4.61 MIL/uL (ref 4.22–5.81)
RDW: 16.6 % — AB (ref 11.5–15.5)
WBC: 13.2 10*3/uL — AB (ref 4.0–10.5)

## 2013-10-01 LAB — PROTIME-INR
INR: 1.41 (ref 0.00–1.49)
Prothrombin Time: 17.3 seconds — ABNORMAL HIGH (ref 11.6–15.2)

## 2013-10-01 LAB — LACTIC ACID, PLASMA: Lactic Acid, Venous: 1.9 mmol/L (ref 0.5–2.2)

## 2013-10-01 LAB — TROPONIN I: Troponin I: <0.3 ng/mL (ref <0.30)

## 2013-10-01 LAB — PHOSPHORUS: Phosphorus: 10 mg/dL — ABNORMAL HIGH (ref 2.3–4.6)

## 2013-10-01 LAB — APTT: APTT: 34 s (ref 24–37)

## 2013-10-01 LAB — MAGNESIUM: Magnesium: 4.5 mg/dL — ABNORMAL HIGH (ref 1.5–2.5)

## 2013-10-01 MED ORDER — INSULIN ASPART 100 UNIT/ML ~~LOC~~ SOLN
10.0000 [IU] | Freq: Once | SUBCUTANEOUS | Status: AC
  Administered 2013-10-01: 10 [IU] via INTRAVENOUS

## 2013-10-01 MED ORDER — METOPROLOL TARTRATE 1 MG/ML IV SOLN: 2.5000 mg | Freq: Four times a day (QID) | INTRAVENOUS | Status: DC | PRN

## 2013-10-01 MED ORDER — SODIUM POLYSTYRENE SULFONATE 15 GM/60ML PO SUSP
30.0000 g | Freq: Two times a day (BID) | ORAL | Status: DC
  Administered 2013-10-01 (×2): 30 g via RECTAL
  Filled 2013-10-01 (×4): qty 120

## 2013-10-01 MED ORDER — SODIUM CHLORIDE 0.9 % IV SOLN
1.0000 g | Freq: Once | INTRAVENOUS | Status: AC
  Administered 2013-10-01: 1 g via INTRAVENOUS
  Filled 2013-10-01: qty 10

## 2013-10-01 MED ORDER — DEXTROSE 50 % IV SOLN
50.0000 mL | Freq: Once | INTRAVENOUS | Status: AC
  Administered 2013-10-01: 50 mL via INTRAVENOUS
  Filled 2013-10-01: qty 50

## 2013-10-01 MED ORDER — SODIUM CHLORIDE 0.9 % IV BOLUS (SEPSIS)
1000.0000 mL | Freq: Once | INTRAVENOUS | Status: AC
  Administered 2013-10-01: 1000 mL via INTRAVENOUS

## 2013-10-01 MED ORDER — SODIUM POLYSTYRENE SULFONATE 15 GM/60ML PO SUSP
15.0000 g | Freq: Once | ORAL | Status: AC
  Administered 2013-10-01: 15 g
  Filled 2013-10-01: qty 60

## 2013-10-01 NOTE — Progress Notes (Signed)
E-Link, MD called with am lab results.  New orders received and initiated.

## 2013-10-01 NOTE — Progress Notes (Signed)
Patient Name: Scott Reese Date of Encounter: 10/01/2013     Active Problems:   COPD exacerbation   Cirrhosis of liver with ascites   Atrial fibrillation with rapid ventricular response   Hyperkalemia   Hypoxia   Elevated brain natriuretic peptide (BNP) level   Cirrhosis   Acute renal failure syndrome   Prostatitis   Urinary retention   Adult failure to thrive   Atrial fibrillation with RVR   Acute respiratory failure    SUBJECTIVE  The patient remained sedated on the ventilator.  No apparent distress. Rhythm atrial fibrillation with controlled ventricular response, rapid at times.  CURRENT MEDS . allopurinol  100 mg Oral Daily  . budesonide-formoterol  2 puff Inhalation BID  . chlorhexidine  15 mL Mouth/Throat QID  . famotidine (PEPCID) IV  20 mg Intravenous QHS  . fentaNYL  50 mcg Intravenous Once  . insulin aspart  0-9 Units Subcutaneous TID WC  . ipratropium-albuterol  3 mL Nebulization Q6H  . methylPREDNISolone (SOLU-MEDROL) injection  60 mg Intravenous 4 times per day  . piperacillin-tazobactam (ZOSYN)  IV  2.25 g Intravenous 4 times per day  . sodium chloride  3 mL Intravenous Q12H  . [START ON 09/12/2013] vancomycin  1,000 mg Intravenous Q48H    OBJECTIVE  Filed Vitals:   10/01/13 0630 10/01/13 0645 10/01/13 0700 10/01/13 0747  BP: 116/69  125/64   Pulse: 124 120 135   Temp:      TempSrc:      Resp: 15 18 19    Height:      Weight:      SpO2: 100% 100% 98% 50%    Intake/Output Summary (Last 24 hours) at 10/01/13 0753 Last data filed at 10/01/13 0648  Gross per 24 hour  Intake   1965 ml  Output    630 ml  Net   1335 ml   Filed Weights   09/19/2013 0230 10/05/2013 0545 10/01/13 0001  Weight: 199 lb 4.7 oz (90.399 kg) 178 lb 9.2 oz (81 kg) 183 lb 3.2 oz (83.1 kg)    PHYSICAL EXAM  General: Intubated. Sedated. Lungs:  Resp regular and unlabored, CTA anteriorly. Heart: Irregular heart rhythm.  Soft apical murmur of mitral regurgitation. Abdomen:  Distended.  Distant bowel sounds. Extremities: No clubbing, cyanosis or edema. DP/PT/Radials 2+ and equal bilaterally.  Accessory Clinical Findings  CBC  Recent Labs  09/22/2013 0057 10/05/2013 1219 10/01/13 0405  WBC 11.0* 15.1* 13.2*  NEUTROABS 9.5*  --   --   HGB 12.7* 11.8* 11.2*  HCT 40.6 39.2 36.9*  MCV 77.8* 79.8 80.0  PLT 367 375 276   Basic Metabolic Panel  Recent Labs  09/24/2013 0431  09/13/2013 1219 10/01/13 0405  NA  --   < > 138 139  K 5.7*  < > 6.2* 6.4*  CL  --   < > 95* 98  CO2  --   < > 23 23  GLUCOSE  --   < > 153* 144*  BUN  --   < > 103* 116*  CREATININE  --   < > 4.59* 5.29*  CALCIUM  --   < > 9.3 8.8  MG 3.5*  --   --  4.5*  PHOS  --   --   --  10.0*  < > = values in this interval not displayed. Liver Function Tests  Recent Labs  09/27/2013 1219 10/01/13 0405  AST 40* 31  ALT 24 19  ALKPHOS 86 71  BILITOT 0.3 0.3  PROT 6.8 6.2  ALBUMIN 2.2* 2.0*   No results found for this basename: LIPASE, AMYLASE,  in the last 72 hours Cardiac Enzymes  Recent Labs  09/23/2013 1219 09/18/2013 1759 10/01/13 0005  TROPONINI <0.30 <0.30 <0.30   BNP No components found with this basename: POCBNP,  D-Dimer No results found for this basename: DDIMER,  in the last 72 hours Hemoglobin A1C No results found for this basename: HGBA1C,  in the last 72 hours Fasting Lipid Panel No results found for this basename: CHOL, HDL, LDLCALC, TRIG, CHOLHDL, LDLDIRECT,  in the last 72 hours Thyroid Function Tests No results found for this basename: TSH, T4TOTAL, FREET3, T3FREE, THYROIDAB,  in the last 72 hours  TELE Atrial fibrillation with variable ventricular response  ECG    Radiology/Studies  Ct Abdomen Pelvis Wo Contrast  10/01/2013   CLINICAL DATA:  Weakness with distention. Concern for ascites secondary to distended abdomen.  EXAM: CT ABDOMEN AND PELVIS WITHOUT CONTRAST  TECHNIQUE: Multidetector CT imaging of the abdomen and pelvis was performed following the  standard protocol without IV contrast.  COMPARISON:  CT chest from 02/29/2012.  FINDINGS: Lung Bases: Right base collapse/ consolidation noted with small to moderate right pleural effusion. Stable appearance of calcified nodal tissue in the right hilum. The heart is enlarged. Coronary artery calcification is noted.  Liver: Liver it appear small and has a peripheral nodular contour, consistent with cirrhosis. 6 mm hypoattenuating lesion in the anterior segment of the right liver (image 27 series 2) is unchanged since the prior chest CT.  Spleen: Normal uninfused features.  No evidence for splenomegaly.  Stomach: Nondistended. No gastric wall thickening. No evidence of outlet obstruction.  Pancreas: No focal mass lesion. No dilatation of the main duct. No intraparenchymal cyst. No peripancreatic edema.  Gallbladder/Biliary: No evidence for gallstones. No pericholecystic fluid. No intrahepatic or extrahepatic biliary dilation.  Kidneys/Adrenals: No adrenal nodule. No hydronephrosis in either kidney. No renal mass is evident on this uninfused exam.  Bowel Loops: Duodenum is normally positioned as is the ligament of Treitz. Mild small bowel distention throughout, measuring up to 3.0 cm in diameter. Small bowel loops are fluid-filled. Loops in the distal ileum or decompressed. The terminal ileum is decompressed but otherwise unremarkable. The appendix is not visualized, but there is no edema or inflammation in the region of the cecum. Diverticular changes are seen in the left colon without diverticulitis.  Nodes: No abdominal lymphadenopathy. No pelvic sidewall lymphadenopathy.  Vasculature: Atherosclerotic calcification is noted in the wall of the abdominal aorta without aneurysm.  Pelvic Genitourinary: Foley catheter decompresses the urinary bladder. Prostate gland is unremarkable.  Bones/Musculoskeletal: Bone windows reveal no worrisome lytic or sclerotic osseous lesions.  Body Wall: Small left inguinal hernia contains  only fat.  Other: Moderate volume ascites is seen. Nodularity in the omentum may be related to edema.  IMPRESSION: Changes in the liver compatible with cirrhosis. There is associated moderate to large volume ascites.  Proximal and mid small bowel is fluid filled and distended up to 3 cm in diameter. Imaging features may be related to ileus although the distal small bowel appears decompressed raising the question of a component of small bowel obstruction.  Right pleural effusion with right lower lobe collapse/consolidation.   Electronically Signed   By: Kennith CenterEric  Mansell M.D.   On: 09/11/2013 02:52   [place] Renal Port  09/28/2013   CLINICAL DATA:  Renal failure  EXAM: RENAL ULTRASOUND  COMPARISON:  CT abdomen and  pelvis September 30, 2013  FINDINGS: Right Kidney:  Length: 10.2 cm. Echogenicity and renal cortical thickness are within normal limits. No mass or hydronephrosis visualized. No perinephric fluid. No sonographically demonstrable calculus or ureterectasis.  Left Kidney:  Length: 10.4 cm. Echogenicity and renal cortical thickness are within normal limits. No mass or hydronephrosis visualized. No perinephric fluid. No sonographically demonstrable calculus or ureterectasis.  Bladder:  Decompressed with Foley catheter  Liver shows evidence of cirrhosis.  There is moderate ascites.  IMPRESSION: No renal lesions are identified. Liver shows evidence of cirrhosis. There is moderate generalized ascites.   Electronically Signed   By: Bretta Bang M.D.   On: 09/10/2013 15:53   Portable Chest Xray In Am  10/01/2013   CLINICAL DATA:  Hypoxia  EXAM: PORTABLE CHEST - 1 VIEW  COMPARISON:  September 30, 2013  FINDINGS: Endotracheal tube tip is 4.7 cm above the carina. Nasogastric tube tip and side port are in the stomach. No pneumothorax. Moderate interstitial edema remains. There is no appreciable airspace consolidation. Heart is enlarged with pulmonary venous hypertension. No adenopathy.  There is a questionable nipple shadow on  the left.  IMPRESSION: Tube positions as described without pneumothorax. Persistent congestive heart failure. No airspace consolidation.  Nodular opacity either in or overlying the left base, not seen 1 day prior. Question nipple shadow ; if this opacity persists on subsequent radiographs, a study with nipple markers in place would be warranted.   Electronically Signed   By: Bretta Bang M.D.   On: 10/01/2013 07:03   Dg Chest Port 1 View  09/27/2013   CLINICAL DATA:  Hypoxia  EXAM: PORTABLE CHEST - 1 VIEW  COMPARISON:  Study obtained earlier in the day  FINDINGS: The endotracheal tube tip is 3.1 cm above the carina. Nasogastric tube tip and side port are in the stomach. No pneumothorax. There is mild interstitial edema, most notably in the perihilar regions bilaterally. There is no frank airspace consolidation. Heart is mildly enlarged with pulmonary vascularity indicating mild pulmonary venous hypertension. No adenopathy.  IMPRESSION: Tube positions as described without pneumothorax. Evidence of a degree of congestive heart failure. No airspace consolidation.   Electronically Signed   By: Bretta Bang M.D.   On: 09/06/2013 14:41   Dg Chest Port 1 View  09/12/2013   CLINICAL DATA:  Endotracheal tube placement.  EXAM: PORTABLE CHEST - 1 VIEW  COMPARISON:  Earlier film, same date.  FINDINGS: The endotracheal tube is 4.0 cm above the carina. The NG tube is in the stomach. The heart is enlarged. Interval development of pulmonary edema with probable left pleural effusion and left lower lobe atelectasis.  IMPRESSION: Endotracheal tube and NG tubes in good position.  Interval development of pulmonary edema with probable left pleural effusion and overlying atelectasis   Electronically Signed   By: Loralie Champagne M.D.   On: 09/26/2013 12:02   Dg Chest Port 1 View  09/13/2013   CLINICAL DATA:  Shortness of breath, abdominal pain and distention.  EXAM: PORTABLE CHEST - 1 VIEW  COMPARISON:  Chest radiograph  performed 06/30/2011, and CT of the chest performed 02/29/2012  FINDINGS: The lungs are well-aerated. Mild bibasilar airspace opacities raise question for mild pneumonia, though minimal interstitial edema might have a similar appearance. A calcified granuloma is noted at the medial right lung base. No definite pleural effusion or pneumothorax is seen.  The cardiomediastinal silhouette is mildly enlarged. Mild vascular congestion is noted. No acute osseous abnormalities are seen.  IMPRESSION:  1. Mild bibasilar airspace opacities raise question for mild pneumonia, though minimal interstitial edema might have a similar appearance. 2. Mild vascular congestion and mild cardiomegaly noted.   Electronically Signed   By: Roanna Raider M.D.   On: 09/11/2013 02:53    ASSESSMENT AND PLAN   Post respiratory arrest.  Question aspiration pneumonia..  1. Chronic diastolic congestive heart failure.  2. mitral regurgitation  3. COPD  4. permanent atrial fibrillation  5. 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 ischemic heart disease  7. status post cardiac arrest secondary to vomiting and aspiration  8. bifascicular block  9. Acute on chronic kidney disease  Plan: Continue supportive care.  Will add low-dose IV metoprolol when necessary for sustained rapid atrial fib.  Signed, Cassell Clement MD

## 2013-10-01 NOTE — Progress Notes (Signed)
eLink Physician-Brief Progress Note Patient Name: Scott Reese DOB: 02-01-1928 MRN: [id number]  Date of Service  10/01/2013   HPI/Events of Note  Grandson who has been one of primary decision makers asked to speak to MD b/c he was not able to speak with physician this am.  Sable FeilGrandon asking about plans moving forward.  He understands patient not doing well and prognosis poor.    eICU Interventions  Grandson available to speak to MD with wife after 10 am tomorrow.  Want to consider focus on comfort tomorrow if appropriate medically.     Intervention Category Major Interventions: End of life / care limitation discussion  Henry RusselSMITH, Shyann Hefner, Demetrius Charity 10/01/2013, 3:59 PM

## 2013-10-01 NOTE — Progress Notes (Addendum)
INITIAL NUTRITION ASSESSMENT  DOCUMENTATION CODES Per approved criteria  -Not Applicable   INTERVENTION: 1.  Enteral nutrition; If enteral nutrition warranted by MD, recommend trophic feeds initially.  Given episode of emesis with aspiration and poor UOP, recommend Osmolite 1.5 @ 10 mL/hr continuous goal to provide 360 kcal, 16g protein, 196 mL free water.  2.  Nutrition-related labs; pt is at high risk for refeeding syndrome.  If TFs intiated, recommend monitoring K, Mg, and phos for at least 3 days.   NUTRITION DIAGNOSIS: Inadequate oral intake related to inability to eat as evidenced by mechanical ventilation, NPO.   Monitor:  1.  Enteral nutrition; initiation with tolerance.  Pt to meet >/=90% estimated needs with nutrition support.  2.  Wt/wt change; monitor trends 3.  GOC; for appropriate nutrition-related interventions  Reason for Assessment: Vent  80 y.o. male  Admitting Dx: altered MS  ASSESSMENT: Pt admitted with AMS, urinary retentions, ascites, and possible mild ileus.  Pt with PMH of cirrhosis.  Pt also with acute-on-chronic renal failure.  RD notes events yesterday whereby pt had episode of emesis with respiratory distress.  Pt required CPR and was intubated.    Patient is currently remains intubated on ventilator support MV: 8.6 L/min Temp (24hrs), Avg:98 F (36.7 C), Min:97.8 F (36.6 C), Max:98.4 F (36.9 C)  Propofol: none  Nutrition Focused Physical Exam: Subcutaneous Fat:  Orbital Region: moderate wasting Upper Arm Region: WNL Thoracic and Lumbar Region: WNL  Muscle:  Temple Region: moderate wasting Clavicle Bone Region: WNL Clavicle and Acromion Bone Region: WNL Scapular Bone Region: WNL Dorsal Hand: WNL Patellar Region: WNL Anterior Thigh Region: WNL Posterior Calf Region: WNL  Edema: none present  Pt's usual weight per chart review is 200-210 lbs.  Pt currently weighs 183 lbs with ascites.  Unable to verify usual weight or eating habits.   No family at bedside.  Given possible weight loss and hx of EtOH with suspected current use, pt is at risk for refeeding syndrome.   Height: Ht Readings from Last 1 Encounters:  10/06/2013 5\' 8"  (1.727 m)    Weight: Wt Readings from Last 1 Encounters:  10/01/13 183 lb 3.2 oz (83.1 kg)    Ideal Body Weight: 70 kg  % Ideal Body Weight: 118%  Wt Readings from Last 10 Encounters:  10/01/13 183 lb 3.2 oz (83.1 kg)  07/18/13 169 lb (76.658 kg)  06/29/13 183 lb 3.2 oz (83.099 kg)  04/06/13 188 lb (85.276 kg)  12/08/12 199 lb 3.2 oz (90.357 kg)  11/16/12 198 lb (89.812 kg)  08/08/12 208 lb 6.4 oz (94.53 kg)  05/17/12 199 lb 4.8 oz (90.402 kg)  05/02/12 204 lb 3.2 oz (92.625 kg)  02/25/12 210 lb 12.8 oz (95.618 kg)    Usual Body Weight: highly variable, 190-210 lbs  % Usual Body Weight: 96%  BMI:  Body mass index is 27.86 kg/(m^2). Overweight  Estimated Nutritional Needs: Kcal: 1657 Protein: 99-116g Fluid: >1.8 L/day  Skin: intact  Diet Order: NPO  EDUCATION NEEDS: -Education not appropriate at this time   Intake/Output Summary (Last 24 hours) at 10/01/13 1200 Last data filed at 10/01/13 1100  Gross per 24 hour  Intake   1980 ml  Output    446 ml  Net   1534 ml    Last BM: PTA  Labs:   Recent Labs Lab 09/07/2013 0057 10/03/2013 0431 09/22/2013 0734 09/09/2013 1219 10/01/13 0405  NA 137  --  138 138 139  K 5.8* 5.7*  5.8* 6.2* 6.4*  CL 93*  --  95* 95* 98  CO2 26  --  27 23 23   BUN 101*  --  101* 103* 116*  CREATININE 4.31*  --  4.29* 4.59* 5.29*  CALCIUM 9.5  --  9.2 9.3 8.8  MG  --  3.5*  --   --  4.5*  PHOS  --   --   --   --  10.0*  GLUCOSE 111*  --  89 153* 144*    CBG (last 3)   Recent Labs  10/05/2013 1646 10/01/2013 2225 10/01/13 0738  GLUCAP 151* 136* 156*    Scheduled Meds: . allopurinol  100 mg Oral Daily  . budesonide-formoterol  2 puff Inhalation BID  . chlorhexidine  15 mL Mouth/Throat QID  . famotidine (PEPCID) IV  20 mg Intravenous  QHS  . fentaNYL  50 mcg Intravenous Once  . insulin aspart  0-9 Units Subcutaneous TID WC  . ipratropium-albuterol  3 mL Nebulization Q6H  . methylPREDNISolone (SOLU-MEDROL) injection  60 mg Intravenous 4 times per day  . piperacillin-tazobactam (ZOSYN)  IV  2.25 g Intravenous 4 times per day  . sodium chloride  3 mL Intravenous Q12H  . sodium polystyrene  30 g Rectal Q12H  . [START ON 09/27/2013] vancomycin  1,000 mg Intravenous Q48H    Continuous Infusions: . diltiazem (CARDIZEM) infusion 5 mg/hr (10/05/2013 0600)  . fentaNYL infusion INTRAVENOUS 125 mcg/hr (10/01/13 0346)    Past Medical History  Diagnosis Date  . COPD (chronic obstructive pulmonary disease)     Severe  . Hyperlipidemia   . Diastolic CHF   . Hypertension   . GERD (gastroesophageal reflux disease)   . Alcohol abuse   . Diastolic heart failure April 2013    Normal EF, mild AR, mild MR, moderate LAE, mod-severely RAE and severe TR  . Chronic anticoagulation     on Coumadin  . Atrial fibrillation   . Coronary artery disease     40-50% LCx by cath 2004    Past Surgical History  Procedure Laterality Date  . Transesophageal echocardiogram  06/18/99    no evidence of any clot in the L atruim or L atrial appendage & no mitral regurgiation/small central jet of aortic insufficiency  . Cardioversion  06/18/99    electrical atrical/ converted after single 200 watt-sec shock with AP paddles/ tolerated procedure well  . Breast surgery    . Cardiac catheterization  2004    EF 50%/L circumflex artery very lg vessel/proximal vessel is approx. 654mm/40-50% eccentric stenosis proximal aspect of vessel    Loyce DysKacie Jakorey Mcconathy, MS RD LDN Clinical Inpatient Dietitian Weekend/After hours pager: [phone]

## 2013-10-01 NOTE — Plan of Care (Signed)
Problem: Phase I Progression Outcomes Goal: Voiding-avoid urinary catheter unless indicated Outcome: Not Progressing Pt urinary output is minimal, foley is required for accurate I&O's.

## 2013-10-01 NOTE — Progress Notes (Signed)
eLink Physician-Brief Progress Note Patient Name: Scott CraftBobby G Herrada DOB: [birth year] MRN: [id number]  Date of Service  10/01/2013   HPI/Events of Note    Recent Labs Lab 09/23/2013 0057 10/05/2013 0431 10/05/2013 0734 09/27/2013 1219 10/01/13 0405  K 5.8* 5.7* 5.8* 6.2* 6.4*    eICU Interventions   Ca, Insulin, D50, Kayexalate   Intervention Category Major Interventions: Electrolyte abnormality - evaluation and management  Joellyn Grandt 10/01/2013, 5:24 AM

## 2013-10-01 NOTE — Progress Notes (Signed)
Patient ZO:XWRUE:Scott Meda KlinefelterG Fiala      DOB: 04-09-1927      AVW:[id number]  Stopped by room. No family present and it does not sound as if they have been here today.  Reviewed labs which are certainly on worrisome trajectory.  Will plan on touching base with family tomorrow.  They had discussed giving trial of ICU support for 24-48h and making decisions from there. Feel free to call with questions or concerns.    Orvis BrillAaron J. Zriyah Kopplin D.O. [facility]  Pager: [phone] Team Phone: [phone]

## 2013-10-01 NOTE — Progress Notes (Signed)
PULMONARY / CRITICAL CARE MEDICINE   Name: Scott Reese MRN: [id number] DOB: Jul 15, 1927    ADMISSION DATE:  09/16/2013 CONSULTATION DATE:  09/25/2013  REFERRING MD :  Izola Price  REASON FOR CONSULTATION:  Aspiration and acute resp failure  INITIAL PRESENTATION:  73 alcoholic admitted 7/25 with acute-on-chronic renal failure, altered MS, urinary retention, ascites, possible mild ileus. Experienced emesis, witnessed aspiration and then resp arrest 7/25 > intubated and underwent brief CPR. PCCM assumed care 7/25.   STUDIES:  7/25 CT abd/ pelvis >> cirrhosis, moderate to large ascites, proximal and mid SB distension and fluid, suspected ileus. R effusion with associated atx.  7/25 Abd [place] > no hydronephrosis. There is cirrhosis and moderate ascites  SIGNIFICANT EVENTS: 7/25 Admitted with AMS, renal failure, A Fib + RVR  7/25 resp arrest after emesis and aspiration 7/25 Limits on care placed per family discussions and pt's wishes > no CPR, ACLS. No HD.   SUBJECTIVE / INTERVAL EVENTS:  ETT was repositioned and MV was changed 7/25 after episode when pt did not have any return volumes > improved Scant UOP, approx 5cc/h kayexalate given this am via GT  VITAL SIGNS: Temp:  [97.4 F (36.3 C)-98.4 F (36.9 C)] 98.4 F (36.9 C) (07/26 0800) Pulse Rate:  [30-138] 66 (07/26 0800) Resp:  [11-49] 14 (07/26 0800) BP: (84-201)/(32-102) 86/57 mmHg (07/26 0800) SpO2:  [50 %-100 %] 100 % (07/26 0800) FiO2 (%):  [50 %-100 %] 50 % (07/26 0800) Weight:  [83.1 kg (183 lb 3.2 oz)] 83.1 kg (183 lb 3.2 oz) (07/26 0001) HEMODYNAMICS:   VENTILATOR SETTINGS: Vent Mode:  [-] PRVC FiO2 (%):  [50 %-100 %] 50 % Set Rate:  [14 bmp-18 bmp] 14 bmp Vt Set:  [550 mL-600 mL] 600 mL PEEP:  [5 cmH20] 5 cmH20 Plateau Pressure:  [22 cmH20-30 cmH20] 23 cmH20 INTAKE / OUTPUT:  Intake/Output Summary (Last 24 hours) at 10/01/13 0849 Last data filed at 10/01/13 0800  Gross per 24 hour  Intake 2012.5 ml  Output    520 ml   Net 1492.5 ml    PHYSICAL EXAMINATION: General:  Ill appearing, intubated and sedated Neuro:  Sedated but wakes to vigorous stim, follows commands HEENT:  pupils 4mm and react, ETT in place Cardiovascular:  Tachy and irregular, distant Lungs:  Coarse and distant, scattered crackles.  Abdomen:  Distended, fluid wave present, tympany present Musculoskeletal:  No deformities Skin:  Cool, no rashes  LABS:  CBC  Recent Labs Lab 09/17/2013 0057 09/11/2013 1219 10/01/13 0405  WBC 11.0* 15.1* 13.2*  HGB 12.7* 11.8* 11.2*  HCT 40.6 39.2 36.9*  PLT 367 375 276   Coag's  Recent Labs Lab 09/11/2013 0057 10/01/13 0405  APTT 45* 34  INR 2.16* 1.41   BMET  Recent Labs Lab 09/08/2013 0734 09/12/2013 1219 10/01/13 0405  NA 138 138 139  K 5.8* 6.2* 6.4*  CL 95* 95* 98  CO2 27 23 23   BUN 101* 103* 116*  CREATININE 4.29* 4.59* 5.29*  GLUCOSE 89 153* 144*   Electrolytes  Recent Labs Lab 10/05/2013 0057 09/27/2013 0431 09/08/2013 0734 10/01/2013 1219 10/01/13 0405  CALCIUM 9.5  --  9.2 9.3 8.8  MG  --  3.5*  --   --  4.5*  PHOS  --   --   --   --  10.0*   Sepsis Markers  Recent Labs Lab 09/07/2013 0114 09/14/2013 1220 10/01/13 0405  LATICACIDVEN 1.65 4.6* 1.9   ABG  Recent Labs Lab 09/23/2013  1235 10/01/13 0413  PHART 7.294* 7.255*  PCO2ART 42.5 54.7*  PO2ART 189.0* 97.5   Liver Enzymes  Recent Labs Lab 09/12/2013 0057 09/18/2013 1219 10/01/13 0405  AST 36 40* 31  ALT 23 24 19   ALKPHOS 87 86 71  BILITOT 0.2* 0.3 0.3  ALBUMIN 2.5* 2.2* 2.0*   Cardiac Enzymes  Recent Labs Lab 09/29/2013 0058 09/09/2013 1219 09/22/2013 1759 10/01/13 0005  TROPONINI  --  <0.30 <0.30 <0.30  PROBNP 18240.0*  --   --   --    Glucose  Recent Labs Lab 09/06/2013 0742 09/29/2013 1246 09/15/2013 1646 09/27/2013 2225 10/01/13 0738  GLUCAP 93 111* 151* 136* 156*    Imaging Ct Abdomen Pelvis Wo Contrast  09/11/2013   CLINICAL DATA:  Weakness with distention. Concern for ascites secondary  to distended abdomen.  EXAM: CT ABDOMEN AND PELVIS WITHOUT CONTRAST  TECHNIQUE: Multidetector CT imaging of the abdomen and pelvis was performed following the standard protocol without IV contrast.  COMPARISON:  CT chest from 02/29/2012.  FINDINGS: Lung Bases: Right base collapse/ consolidation noted with small to moderate right pleural effusion. Stable appearance of calcified nodal tissue in the right hilum. The heart is enlarged. Coronary artery calcification is noted.  Liver: Liver it appear small and has a peripheral nodular contour, consistent with cirrhosis. 6 mm hypoattenuating lesion in the anterior segment of the right liver (image 27 series 2) is unchanged since the prior chest CT.  Spleen: Normal uninfused features.  No evidence for splenomegaly.  Stomach: Nondistended. No gastric wall thickening. No evidence of outlet obstruction.  Pancreas: No focal mass lesion. No dilatation of the main duct. No intraparenchymal cyst. No peripancreatic edema.  Gallbladder/Biliary: No evidence for gallstones. No pericholecystic fluid. No intrahepatic or extrahepatic biliary dilation.  Kidneys/Adrenals: No adrenal nodule. No hydronephrosis in either kidney. No renal mass is evident on this uninfused exam.  Bowel Loops: Duodenum is normally positioned as is the ligament of Treitz. Mild small bowel distention throughout, measuring up to 3.0 cm in diameter. Small bowel loops are fluid-filled. Loops in the distal ileum or decompressed. The terminal ileum is decompressed but otherwise unremarkable. The appendix is not visualized, but there is no edema or inflammation in the region of the cecum. Diverticular changes are seen in the left colon without diverticulitis.  Nodes: No abdominal lymphadenopathy. No pelvic sidewall lymphadenopathy.  Vasculature: Atherosclerotic calcification is noted in the wall of the abdominal aorta without aneurysm.  Pelvic Genitourinary: Foley catheter decompresses the urinary bladder. Prostate  gland is unremarkable.  Bones/Musculoskeletal: Bone windows reveal no worrisome lytic or sclerotic osseous lesions.  Body Wall: Small left inguinal hernia contains only fat.  Other: Moderate volume ascites is seen. Nodularity in the omentum may be related to edema.  IMPRESSION: Changes in the liver compatible with cirrhosis. There is associated moderate to large volume ascites.  Proximal and mid small bowel is fluid filled and distended up to 3 cm in diameter. Imaging features may be related to ileus although the distal small bowel appears decompressed raising the question of a component of small bowel obstruction.  Right pleural effusion with right lower lobe collapse/consolidation.   Electronically Signed   By: Kennith Center M.D.   On: 09/19/2013 02:52   US Renal Port  10/03/2013   CLINICAL DATA:  Renal failure  EXAM: RENAL ULTRASOUND  COMPARISON:  CT abdomen and pelvis September 30, 2013  FINDINGS: Right Kidney:  Length: 10.2 cm. Echogenicity and renal cortical thickness are within normal limits. No  mass or hydronephrosis visualized. No perinephric fluid. No sonographically demonstrable calculus or ureterectasis.  Left Kidney:  Length: 10.4 cm. Echogenicity and renal cortical thickness are within normal limits. No mass or hydronephrosis visualized. No perinephric fluid. No sonographically demonstrable calculus or ureterectasis.  Bladder:  Decompressed with Foley catheter  Liver shows evidence of cirrhosis.  There is moderate ascites.  IMPRESSION: No renal lesions are identified. Liver shows evidence of cirrhosis. There is moderate generalized ascites.   Electronically Signed   By: Bretta Bang M.D.   On: 09/29/2013 15:53   Dg Chest Port 1 View  09/12/2013   CLINICAL DATA:  Hypoxia  EXAM: PORTABLE CHEST - 1 VIEW  COMPARISON:  Study obtained earlier in the day  FINDINGS: The endotracheal tube tip is 3.1 cm above the carina. Nasogastric tube tip and side port are in the stomach. No pneumothorax. There is mild  interstitial edema, most notably in the perihilar regions bilaterally. There is no frank airspace consolidation. Heart is mildly enlarged with pulmonary vascularity indicating mild pulmonary venous hypertension. No adenopathy.  IMPRESSION: Tube positions as described without pneumothorax. Evidence of a degree of congestive heart failure. No airspace consolidation.   Electronically Signed   By: Bretta Bang M.D.   On: 09/20/2013 14:41   Dg Chest Port 1 View  09/18/2013   CLINICAL DATA:  Endotracheal tube placement.  EXAM: PORTABLE CHEST - 1 VIEW  COMPARISON:  Earlier film, same date.  FINDINGS: The endotracheal tube is 4.0 cm above the carina. The NG tube is in the stomach. The heart is enlarged. Interval development of pulmonary edema with probable left pleural effusion and left lower lobe atelectasis.  IMPRESSION: Endotracheal tube and NG tubes in good position.  Interval development of pulmonary edema with probable left pleural effusion and overlying atelectasis   Electronically Signed   By: Loralie Champagne M.D.   On: 09/29/2013 12:02   Dg Chest Port 1 View  09/14/2013   CLINICAL DATA:  Shortness of breath, abdominal pain and distention.  EXAM: PORTABLE CHEST - 1 VIEW  COMPARISON:  Chest radiograph performed 06/30/2011, and CT of the chest performed 02/29/2012  FINDINGS: The lungs are well-aerated. Mild bibasilar airspace opacities raise question for mild pneumonia, though minimal interstitial edema might have a similar appearance. A calcified granuloma is noted at the medial right lung base. No definite pleural effusion or pneumothorax is seen.  The cardiomediastinal silhouette is mildly enlarged. Mild vascular congestion is noted. No acute osseous abnormalities are seen.  IMPRESSION: 1. Mild bibasilar airspace opacities raise question for mild pneumonia, though minimal interstitial edema might have a similar appearance. 2. Mild vascular congestion and mild cardiomegaly noted.   Electronically Signed    By: Roanna Raider M.D.   On: 09/29/2013 02:53     ASSESSMENT / PLAN:  PULMONARY OETT 7/25 >>  A: Acute respiratory failure Aspiration event, high risk for aspiration PNA or ARDS COPD, tobacco hx P:   - PRVC > change to 8cc/kg on 7/26 based on evolving infiltrates. Do not believe he will tolerate further decrease in Vt due to acidosis - scheduled BD's - continue solumedrol for suspected component AE-COPD - abx as below - discussed the utility of MV and overall prognosis with his granddaughter Fabio Bering, wife on 7/25. They understand poor prognosis for meaningful recovery to QOL that would be acceptable to him. We have agreed to watch him through the weekend and then determine whether to continue aggressive support depending on whether there is any  improvement, particularly in renal fxn. No plans for CPR / ACLS.   CARDIOVASCULAR CVL none A: A fib with RVR CAD, serial trop negative Hypotension P:  - dilt gtt and metoprolol held; will restart low dose metop if BP will tolerate - IVF support - phenylephrine acceptable via PIV if needed for MAP > 60 - hold anticoagulation at this time (on coumadin outpt)  RENAL A:  Acute renal failure, suspect due to dehydration and poor PO intake over the last week. Consider also an obstructive nephropathy w hx prostatitis and oliguria.  Hyperkalemia P:   - kayexalate, change to PR as unclear what is being absorbed via GT - IVF support, bolused IVF - follow BMP and treat hyperkalemia as indicated - have discussed with family 7/25 that he is a poor candidate for HD, would not want dependent lifestyle.   GASTROINTESTINAL A:  Cirrhosis Small Bowel Obstruction Ascites Possible GIB > dark emesis P:   - OGT to suction - follow CBC and GI consultation if we believe GIB an active issue - will not perform a large volume paracentesis as he would not tolerate the fluid shifts hemodynamically.   HEMATOLOGIC A:  Coagulopathy - on coumadin, liver  disease P:  - hold coumadin  - Vit K - follow LFT  INFECTIOUS A:  Suspected prostatitis on presentation High risk for aspiration PNA At risk for SBP P:   BCx2 7/25 >>  UC 7/25 >>  Sputum7/25 >>  Abx:  Vanco, start date 7/25, day 2/x  Zosyn, start date 7/25, day2/x  ENDOCRINE A:  Hyperglycemia, on steroids P:   SSI per protocol  NEUROLOGIC A:  Sedation while ventilated P:   RASS goal: -1 to 0 - fentanyl gtt ordered - versed prn   TODAY'S SUMMARY:  Admitted with renal failure, altered MS, superimposed on cirrhosis, COPD, A fib, CAD. Unfortunately experienced emesis, aspiration and resp arrest requiring ETT/MV 7/25. Treating hyperkalemia, following for any renal recovery. If none then family will desire withdrawal of care.   Family discussion 7/25: I had a family meeting with the patient's wife, grandson and granddaughter 7/25. Explained the events of today and his illness on presentation. I have explained that prognosis for a meaningful recovery to his previous good QOL is poor but not impossible. They all agree that he would not want a life that was characterized by dependent care, SNF, etc. I have recommended that we support him aggressively medically with abx, MV, IVF, etc and follow for 24-48 h to see if he progresses. They agree and do not want prolonged support if he doesn't have a good chance to recover to a good QOL. We will defer CPR/ACLS. If he needs pressors then we would use phenylephrine via peripheral IV temporarily. We have agreed that HD would not be beneficial. I will update them on 7/26 and we will decide about continued support at that time. They will desire withdrawal of care if he does not show improvement in next 2 days.   I have personally obtained a history, examined the patient, evaluated laboratory and imaging results, formulated the assessment and plan and placed orders.  CRITICAL CARE: The patient is critically ill with multiple organ systems failure and  requires high complexity decision making for assessment and support, frequent evaluation and titration of therapies, application of advanced monitoring technologies and extensive interpretation of multiple databases. Critical Care Time devoted to patient care services described in this note is 40 minutes total.   Levy Pupa, MD, PhD  10/01/2013, 8:49 AM  Pulmonary and Critical Care [phone] or if no answer [phone]

## 2013-10-02 ENCOUNTER — Inpatient Hospital Stay (HOSPITAL_COMMUNITY): Payer: Medicare Other

## 2013-10-02 DIAGNOSIS — J441 Chronic obstructive pulmonary disease with (acute) exacerbation: Secondary | ICD-10-CM

## 2013-10-02 DIAGNOSIS — I5023 Acute on chronic systolic (congestive) heart failure: Secondary | ICD-10-CM

## 2013-10-02 DIAGNOSIS — I509 Heart failure, unspecified: Secondary | ICD-10-CM

## 2013-10-02 LAB — BASIC METABOLIC PANEL
Anion gap: 20 — ABNORMAL HIGH (ref 5–15)
BUN: 126 mg/dL — ABNORMAL HIGH (ref 6–23)
CALCIUM: 8.9 mg/dL (ref 8.4–10.5)
CO2: 22 mEq/L (ref 19–32)
Chloride: 97 mEq/L (ref 96–112)
Creatinine, Ser: 6.19 mg/dL — ABNORMAL HIGH (ref 0.50–1.35)
GFR calc Af Amer: 9 mL/min — ABNORMAL LOW (ref >90)
GFR calc non Af Amer: 7 mL/min — ABNORMAL LOW (ref >90)
GLUCOSE: 134 mg/dL — AB (ref 70–99)
Potassium: 6.5 mEq/L (ref 3.7–5.3)
SODIUM: 139 meq/L (ref 137–147)

## 2013-10-02 LAB — CULTURE, RESPIRATORY W GRAM STAIN: Special Requests: NORMAL

## 2013-10-02 LAB — CULTURE, RESPIRATORY

## 2013-10-02 LAB — PHOSPHORUS: Phosphorus: 10.4 mg/dL — ABNORMAL HIGH (ref 2.3–4.6)

## 2013-10-02 LAB — GLUCOSE, CAPILLARY: Glucose-Capillary: 142 mg/dL — ABNORMAL HIGH (ref 70–99)

## 2013-10-02 LAB — CBC
HCT: 34.4 % — ABNORMAL LOW (ref 39.0–52.0)
HEMOGLOBIN: 10.3 g/dL — AB (ref 13.0–17.0)
MCH: 24.3 pg — AB (ref 26.0–34.0)
MCHC: 29.9 g/dL — AB (ref 30.0–36.0)
MCV: 81.3 fL (ref 78.0–100.0)
Platelets: 233 10*3/uL (ref 150–400)
RBC: 4.23 MIL/uL (ref 4.22–5.81)
RDW: 16.9 % — ABNORMAL HIGH (ref 11.5–15.5)
WBC: 10.3 10*3/uL (ref 4.0–10.5)

## 2013-10-02 LAB — MAGNESIUM: MAGNESIUM: 4.6 mg/dL — AB (ref 1.5–2.5)

## 2013-10-02 LAB — VANCOMYCIN, RANDOM: VANCOMYCIN RM: 8.7 ug/mL

## 2013-10-02 MED ORDER — MORPHINE SULFATE 10 MG/ML IJ SOLN
1.0000 mg/h | INTRAVENOUS | Status: DC
  Administered 2013-10-02: 1 mg/h via INTRAVENOUS
  Filled 2013-10-02: qty 10

## 2013-10-02 MED ORDER — DILTIAZEM HCL 25 MG/5ML IV SOLN: 5.0000 mg | Freq: Once | INTRAVENOUS | Status: DC

## 2013-10-02 MED ORDER — MORPHINE BOLUS VIA INFUSION
5.0000 mg | INTRAVENOUS | Status: DC | PRN
  Filled 2013-10-02: qty 20

## 2013-10-02 MED ORDER — MIDAZOLAM BOLUS VIA INFUSION
1.0000 mg | INTRAVENOUS | Status: DC | PRN
  Filled 2013-10-02: qty 2

## 2013-10-02 MED ORDER — DILTIAZEM LOAD VIA INFUSION
5.0000 mg | Freq: Once | INTRAVENOUS | Status: AC
  Administered 2013-10-02: 5 mg via INTRAVENOUS
  Filled 2013-10-02: qty 5

## 2013-10-02 MED ORDER — SODIUM POLYSTYRENE SULFONATE 15 GM/60ML PO SUSP
45.0000 g | Freq: Once | ORAL | Status: AC
  Administered 2013-10-02: 45 g
  Filled 2013-10-02: qty 180

## 2013-10-02 MED ORDER — METOPROLOL TARTRATE 1 MG/ML IV SOLN: 2.5000 mg | Freq: Four times a day (QID) | INTRAVENOUS | Status: DC | PRN

## 2013-10-02 MED ORDER — SODIUM CHLORIDE 0.9 % IV SOLN
1.0000 mg/h | INTRAVENOUS | Status: DC
  Administered 2013-10-02: 1 mg/h via INTRAVENOUS
  Filled 2013-10-02: qty 10

## 2013-10-02 MED ORDER — SODIUM BICARBONATE 8.4 % IV SOLN
INTRAVENOUS | Status: DC
  Administered 2013-10-02: 07:00:00 via INTRAVENOUS
  Filled 2013-10-02 (×2): qty 150

## 2013-10-02 MED ORDER — METHYLPREDNISOLONE SODIUM SUCC 125 MG IJ SOLR
60.0000 mg | Freq: Three times a day (TID) | INTRAMUSCULAR | Status: DC
Start: 2013-10-02 — End: 2013-10-02

## 2013-10-03 NOTE — Accreditation Note (Signed)
o Restraints not reported to CMS Pursuant to regulation 482.13 (G) (3) use of soft wrist restraints was logged on (07.28.2015 at 0730) by Rosine Door(Ashleyanne Hemmingway, RN, Patient Insurance account managerafety and Accreditation Coordinator at [facility]).

## 2013-10-06 LAB — CULTURE, BLOOD (ROUTINE X 2)
CULTURE: NO GROWTH
Culture: NO GROWTH

## 2013-10-07 NOTE — Procedures (Signed)
Extubation Procedure Note  Patient Details:   Name: Mariel CraftBobby G Abts DOB: 1927-08-02 MRN: [id number]   Airway Documentation:   Terminal extubation per MD order and family wishes. Family & RN @ bedside.   Evaluation  O2 sats: stable throughout Complications: No apparent complications Patient did tolerate procedure well. Bilateral Breath Sounds: Diminished Suctioning: Airway Yes  Kendall FlackJackson, Jarryd Gratz Royal 09/14/2013, 1:39 PM

## 2013-10-07 NOTE — Progress Notes (Addendum)
CRITICAL VALUE ALERT  Critical value received:  K=6.5  Date of notification:  05-07-13  Time of notification:  05:57  Critical value read back:Yes.    Nurse who received alert:  Guinevere ScarleteLisa Shawnta Schlegel, RN  MD notified (1st page):  Dr Tyson AliasFeinstein  Time of first page:  05:57  MD notified (2nd page):  Time of second page:  Responding MD:  Dr Tyson AliasFeinstein  Time MD responded:  06:18

## 2013-10-07 NOTE — Progress Notes (Signed)
eLink Physician-Brief Progress Note Patient Name: Scott CraftBobby G Reese DOB: 1927/11/17 MRN: [id number]  Date of Service  09/23/2013   HPI/Events of Note   k 6.5, for fam meeting  eICU Interventions  V bicar b drip kayxlate Will need repeat testing   Intervention Category Major Interventions: Electrolyte abnormality - evaluation and management  FEINSTEIN,DANIEL J. 09/22/2013, 6:18 AM

## 2013-10-07 NOTE — Progress Notes (Signed)
Patient passed at [id number] with family at bedside. MD made aware. Appropriate calls made.

## 2013-10-07 NOTE — Progress Notes (Signed)
Clinical Social Work Department BRIEF PSYCHOSOCIAL ASSESSMENT 10-07-2013  Patient:  Scott Reese, Scott Reese     Account Number:  [id number]     Admit date:  10/03/2013  Clinical Social Worker:  Ulyess Blossom  Date/Time:  2013-10-07 01:00 PM  Referred by:  RN  Date Referred:  October 07, 2013 Referred for  Other - See comment   Other Referral:   comfort care, support for wife, clarify pt grandson questions regarding paternity testing   Interview type:  Family Other interview type:    PSYCHOSOCIAL DATA Living Status:  WIFE Admitted from facility:   Level of care:   Primary support name:  Anite Vaquera/wife/[phone] Primary support relationship to patient:  SPOUSE Degree of support available:   adequate    CURRENT CONCERNS Current Concerns  Post-Acute Placement   Other Concerns:    SOCIAL WORK ASSESSMENT / PLAN CSW received phone call from RN stating that pt is to be terminally weaned today and pt family had questions for CSW regarding assistance for pt wife at home and about obtaining a DNA test for paternity testing prior to terminal wean.    CSW met with pt wife, pt grandson and his wife, and pt son-in-law at bedside. Pt intubated at this time. CSW introduced self and explained role. Pt wife discussed that she is interested in receiving some assistance in the home as she has physical ailments and will also be coping with the loss of her husband. Pt wife shared that she has hired services from [employer] to come out a few days a week, but was curious what other services may be able to assist. CSW recommended for pt to contact her primary care doctor who can assist with arranging home health services in the home. CSW explained since pt wife is not a patient in the hospital then CSW unable to assist in arranging as the home health company needs order from the doctor. Pt wife expressed understanding and plans to reach out to her primary care doctor for assistance with implementing home health  services. CSW also discussed resource of hospice for pt wife support following pt passing.    Pt grandson stated that he was interested in having a paternity test from pt prior to terminal wean. CSW inquired with pt grandson and pt wife if pt had an advanced directive or living will requesting a paternity test at end of life. Per pt wife, pt has a living will, but it does not indicate a paternity test. Pt grandson stated that he has had the question for many years and his mother whom was pt daughter is no longer living. CSW provided support and discussed that the hospital is unable to assist in paternity testing if the pt does not have that wish documented clearly in an advanced directive or living will. CSW discussed with pt grandson that he has the right to seek legal advice, but pt terminal wean cannot be delayed while legal advice is seeked. Pt grandson expressed understanding and recognizes that the hospital has to follow policies and procedures to protect pt rights.    CSW updated RN and pt family reported to RN that they are now agreeable to terminal extubation.    CSW to remain available for additional support if needed.   Assessment/plan status:  Psychosocial Support/Ongoing Assessment of Needs Other assessment/ plan:   Information/referral to community resources:   Discussed with pt wife to contact her primary doctor in order to get home health services in the home for an additional  support for pt wife    PATIENT'S/FAMILY'S RESPONSE TO PLAN OF CARE: Pt intubated and on the vent during CSW assessment. Pt wife tearful and CSW allowed space for pt wife and pt family to share stories about pt. Pt grandson was hopeful to have paternity test, but understanding of CSW explanation of why the hospital would be unable to offer a specialized test as that without documentation that the pt wished for it. Pt grandson and his wife and pt son-in-law appear supportive of pt wife and the needs when may have  following pt passing.    Alison Murray, MSW, [place] Work [phone]

## 2013-10-07 NOTE — Progress Notes (Addendum)
PULMONARY / CRITICAL CARE MEDICINE   Name: Scott Reese MRN: [id number] DOB: [birth year]    ADMISSION DATE:  09/11/2013 CONSULTATION DATE:  09/14/2013  REFERRING MD :  Izola PriceMyers  REASON FOR CONSULTATION:  Aspiration and acute resp failure  INITIAL PRESENTATION:  [age] alcoholic admitted 7/25 with acute-on-chronic renal failure, altered MS, urinary retention, ascites, possible mild ileus. Experienced emesis, witnessed aspiration and then resp arrest 7/25 > intubated and underwent brief CPR. PCCM assumed care 7/25. PMH - COPD FEV1 1.46-50%   STUDIES:  7/25 CT abd/ pelvis >> cirrhosis, moderate to large ascites, proximal and mid SB distension and fluid, suspected ileus. R effusion with associated atx.  7/25 Abd US > no hydronephrosis. There is cirrhosis and moderate ascites  SIGNIFICANT EVENTS: 7/25 Admitted with AMS, renal failure, A Fib + RVR  7/25 resp arrest after emesis and aspiration 7/25 Limits on care placed per family discussions and pt's wishes > no CPR, ACLS. No HD.  7/25 ETT was repositioned and MV was changed  after episode when pt did not have any return volumes   SUBJECTIVE / INTERVAL EVENTS:  afebrile Poor UOP RVR- started on cardizem gtt kayexalate given   VITAL SIGNS: Temp:  [97.8 F (36.6 C)-98.8 F (37.1 C)] 97.8 F (36.6 C) (07/27 0842) Pulse Rate:  [33-143] 61 (07/27 0700) Resp:  [12-19] 15 (07/27 0700) BP: (88-154)/(45-131) 133/109 mmHg (07/27 0700) SpO2:  [96 %-100 %] 98 % (07/27 0700) FiO2 (%):  [40 %] 40 % (07/27 0842) Weight:  [85.6 kg (188 lb 11.4 oz)] 85.6 kg (188 lb 11.4 oz) (07/27 0400) HEMODYNAMICS:   VENTILATOR SETTINGS: Vent Mode:  [-] PRVC FiO2 (%):  [40 %] 40 % Set Rate:  [14 bmp] 14 bmp Vt Set:  [600 mL] 600 mL PEEP:  [5 cmH20] 5 cmH20 Plateau Pressure:  [17 cmH20-33 cmH20] 27 cmH20 INTAKE / OUTPUT:  Intake/Output Summary (Last 24 hours) at Jul 07, 2013 0949 Last data filed at Jul 07, 2013 0700  Gross per 24 hour  Intake 1095.41 ml  Output     62  ml  Net 1033.41 ml    PHYSICAL EXAMINATION: General:  Ill appearing, intubated and sedated Neuro:  Sedated but wakes to vigorous stim, follows commands but restless, agitated HEENT:  pupils 4mm and react, ETT in place Cardiovascular:  Tachy and irregular, distant Lungs:  Coarse and distant, scattered crackles.  Abdomen:  Distended, fluid wave present, tympany present Musculoskeletal:  No deformities Skin:  Cool, no rashes  LABS:  CBC  Recent Labs Lab 10/03/2013 1219 10/01/13 0405 Jul 07, 2013 0512  WBC 15.1* 13.2* 10.3  HGB 11.8* 11.2* 10.3*  HCT 39.2 36.9* 34.4*  PLT 375 276 233   Coag's  Recent Labs Lab 10/05/2013 0057 10/01/13 0405  APTT 45* 34  INR 2.16* 1.41   BMET  Recent Labs Lab 10/01/13 0405 10/01/13 1352 Jul 07, 2013 0512  NA 139 140 139  K 6.4* 6.2* 6.5*  CL 98 100 97  CO2 23 22 22   BUN 116* 116* 126*  CREATININE 5.29* 5.48* 6.19*  GLUCOSE 144* 124* 134*   Electrolytes  Recent Labs Lab 09/20/2013 0431  10/01/13 0405 10/01/13 1352 Jul 07, 2013 0512  CALCIUM  --   < > 8.8 8.7 8.9  MG 3.5*  --  4.5*  --  4.6*  PHOS  --   --  10.0*  --  10.4*  < > = values in this interval not displayed. Sepsis Markers  Recent Labs Lab 09/29/2013 0114 09/20/2013 1220 10/01/13 0405  LATICACIDVEN  1.65 4.6* 1.9   ABG  Recent Labs Lab 09/14/2013 1235 10/01/13 0413  PHART 7.294* 7.255*  PCO2ART 42.5 54.7*  PO2ART 189.0* 97.5   Liver Enzymes  Recent Labs Lab 10/06/2013 0057 09/15/2013 1219 10/01/13 0405  AST 36 40* 31  ALT 23 24 19   ALKPHOS 87 86 71  BILITOT 0.2* 0.3 0.3  ALBUMIN 2.5* 2.2* 2.0*   Cardiac Enzymes  Recent Labs Lab 09/16/2013 0058 09/19/2013 1219 10/01/2013 1759 10/01/13 0005  TROPONINI  --  <0.30 <0.30 <0.30  PROBNP 18240.0*  --   --   --    Glucose  Recent Labs Lab 10/05/2013 2225 10/01/13 0738 10/01/13 1323 10/01/13 1553 10/01/13 2301 09/29/2013 0756  GLUCAP 136* 156* 108* 119* 112* 142*    Imaging Portable Chest Xray In  Am  10/01/2013   CLINICAL DATA:  Hypoxia  EXAM: PORTABLE CHEST - 1 VIEW  COMPARISON:  September 30, 2013  FINDINGS: Endotracheal tube tip is 4.7 cm above the carina. Nasogastric tube tip and side port are in the stomach. No pneumothorax. Moderate interstitial edema remains. There is no appreciable airspace consolidation. Heart is enlarged with pulmonary venous hypertension. No adenopathy.  There is a questionable nipple shadow on the left.  IMPRESSION: Tube positions as described without pneumothorax. Persistent congestive heart failure. No airspace consolidation.  Nodular opacity either in or overlying the left base, not seen 1 day prior. Question nipple shadow ; if this opacity persists on subsequent radiographs, a study with nipple markers in place would be warranted.   Electronically Signed   By: Bretta Bang M.D.   On: 10/01/2013 07:03     ASSESSMENT / PLAN:  PULMONARY OETT 7/25 >>  A: Acute respiratory failure Aspiration PNA COPD, tobacco hx P:   - PRVC ventilation ,tolerates SBTs but HR limiting this am, doubt he would sustain for long - scheduled BD's - continue solumedrol for suspected component AE-COPD - abx as below - Plan for one -way extubation   CARDIOVASCULAR CVL none A: A fib with RVR CAD, serial trop negative Hypotension P:  - dilt gtt and metoprolol restarted - phenylephrine acceptable via PIV if needed for MAP > 60 - hold anticoagulation at this time (on coumadin outpt)  RENAL A:  Acute renal failure, suspect due to dehydration and poor PO intake over the last week.  Renal US neg for  obstructive nephropathy  Hyperkalemia P:   - kayexalate via NGT - follow BMP and treat hyperkalemia as indicated - note discussed with family 7/25 that he is a poor candidate for HD, would not want dependent lifestyle.   GASTROINTESTINAL A:  Cirrhosis Small Bowel Obstruction Ascites Possible GIB > dark emesis P:   - OGT to suction - follow CBC and GI consultation if we  believe GIB an active issue - will not perform a large volume paracentesis as he would not tolerate the fluid shifts hemodynamically.   HEMATOLOGIC A:  Coagulopathy - on coumadin, liver disease, resolved with vit K P:  - hold coumadin  - follow LFT  INFECTIOUS A:  Suspected prostatitis on presentation High risk for aspiration PNA At risk for SBP P:   BCx2 7/25 >> ng UC 7/25 >> ng Sputum7/25 >>  Abx:  Vanco, start date 7/25  >> 7/27  Zosyn, start date 7/25  ENDOCRINE A:  Hyperglycemia, on steroids P:   SSI per protocol  NEUROLOGIC A:  Sedation while ventilated P:   RASS goal: -1 to 0 - fentanyl gtt ordered -  versed prn   TODAY'S SUMMARY:  WOrsening  renal failure, altered MS, superimposed on cirrhosis, COPD, A fib-RVR, CAD. Unfortunately experienced emesis, aspiration and resp arrest requiring ETT/MV 7/25. Treating hyperkalemia, following for any renal recovery.Will d/w  family -regardingwithdrawal of care.   Family discussion 7/25: Dr Delton Coombes had a family meeting with the patient's wife, grandson and granddaughter 7/25. Explained the events and his illness on presentation. I have explained that prognosis for a meaningful recovery to his previous good QOL is poor but not impossible. They all agree that he would not want a life that was characterized by dependent care, SNF, etc. DNR , no HD . I will update them on 7/26 and we will decide about continued support at that time.  I have personally obtained a history, examined the patient, evaluated laboratory and imaging results, formulated the assessment and plan and placed orders.  CRITICAL CARE: The patient is critically ill with multiple organ systems failure and requires high complexity decision making for assessment and support, frequent evaluation and titration of therapies, application of advanced monitoring technologies and extensive interpretation of multiple databases. Critical Care Time devoted to patient care services  described in this note is 40 minutes total.   Cyril Mourning MD. [facility] [place] [facility] Pager [phone] If no response call [phone]   Addendum - detailed discussion with family. Withdraw vent with morphine & versed gtt. Focus on full comfort. Wife may need social service support.    09/08/2013, 9:49 AM

## 2013-10-07 NOTE — Progress Notes (Signed)
Wasted 90ml of 1210mc/ml Fentanyl in sink with [place] OuchStephanie Russell, RN.

## 2013-10-07 NOTE — Progress Notes (Signed)
ANTIBIOTIC CONSULT NOTE - FOLLOW UP  Pharmacy Consult for vancomycin Indication: rule out sepsis  No Known Allergies  Patient Measurements: Height: 5\' 8"  (172.7 cm) Weight: 188 lb 11.4 oz (85.6 kg) IBW/kg (Calculated) : 68.4 Adjusted Body Weight:   Vital Signs: Temp: 98 F (36.7 C) (07/27 0400) Temp src: Axillary (07/27 0400) BP: 107/55 mmHg (07/27 0500) Pulse Rate: 75 (07/27 0500) Intake/Output from previous day: 07/26 0701 - 07/27 0700 In: 875.4 [I.V.:645.4; NG/GT:30; IV Piggyback:200] Out: 72 [Urine:72] Intake/Output from this shift: Total I/O In: 415.4 [I.V.:315.4; IV Piggyback:100] Out: 20 [Urine:20]  Labs:  Recent Labs  10/03/2013 1219 10/01/13 0405 10/01/13 1352 09/17/2013 0512  WBC 15.1* 13.2*  --  10.3  HGB 11.8* 11.2*  --  10.3*  PLT 375 276  --  233  CREATININE 4.59* 5.29* 5.48* 6.19*   Estimated Creatinine Clearance: 9.6 ml/min (by C-G formula based on Cr of 6.19).  Recent Labs  10/06/2013 0512  VANCORANDOM 8.7     Microbiology: Recent Results (from the past 720 hour(s))  CULTURE, BLOOD (ROUTINE X 2)     Status: None   Collection Time    09/17/2013  1:00 AM      Result Value Ref Range Status   Specimen Description BLOOD RIGHT ANTECUBITAL   Final   Special Requests BOTTLES DRAWN AEROBIC ONLY 4 CC   Final   Culture  Setup Time     Final   Value: 09/09/2013 14:06     Performed at [facility]   Culture     Final   Value:        BLOOD CULTURE RECEIVED NO GROWTH TO DATE CULTURE WILL BE HELD FOR 5 DAYS BEFORE ISSUING A FINAL NEGATIVE REPORT     Performed at [facility]   Report Status PENDING   Incomplete  CULTURE, BLOOD (ROUTINE X 2)     Status: None   Collection Time    09/29/2013  1:01 AM      Result Value Ref Range Status   Specimen Description BLOOD BLOOD LEFT FOREARM   Final   Special Requests BOTTLES DRAWN AEROBIC ONLY 4CC   Final   Culture  Setup Time     Final   Value: 09/21/2013 14:06     Performed at [facility]    Culture     Final   Value:        BLOOD CULTURE RECEIVED NO GROWTH TO DATE CULTURE WILL BE HELD FOR 5 DAYS BEFORE ISSUING A FINAL NEGATIVE REPORT     Performed at [facility]   Report Status PENDING   Incomplete  URINE CULTURE     Status: None   Collection Time    09/29/2013  1:14 AM      Result Value Ref Range Status   Specimen Description URINE, CATHETERIZED   Final   Special Requests NONE   Final   Culture  Setup Time     Final   Value: 09/28/2013 15:47     Performed at [facility] Count     Final   Value: NO GROWTH     Performed at [facility]   Culture     Final   Value: NO GROWTH     Performed at [facility]   Report Status 10/01/2013 FINAL   Final  MRSA PCR SCREENING     Status: None   Collection Time    09/15/2013  5:46 AM  Result Value Ref Range Status   MRSA by PCR NEGATIVE  NEGATIVE Final   Comment:            The GeneXpert MRSA Assay (FDA     approved for NASAL specimens     only), is one component of a     comprehensive MRSA colonization     surveillance program. It is not     intended to diagnose MRSA     infection nor to guide or     monitor treatment for     MRSA infections.  CULTURE, RESPIRATORY (NON-EXPECTORATED)     Status: None   Collection Time    10/01/2013  2:27 PM      Result Value Ref Range Status   Specimen Description TRACHEAL ASPIRATE   Final   Special Requests Normal   Final   Gram Stain     Final   Value: MODERATE WBC PRESENT, PREDOMINANTLY PMN     FEW SQUAMOUS EPITHELIAL CELLS PRESENT     MODERATE GRAM POSITIVE COCCI IN PAIRS     ABUNDANT YEAST     [facility]   Culture     Final   Value: Culture reincubated for better growth     [facility]   Report Status PENDING   Incomplete    Anti-infectives   Start     Dose/Rate Route Frequency Ordered Stop   09/07/2013 0600  vancomycin (VANCOCIN) IVPB 1000 mg/200 mL premix     1,000 mg 200 mL/hr over 60  Minutes Intravenous Every 48 hours 09/18/2013 0600     09/07/2013 1200  piperacillin-tazobactam (ZOSYN) IVPB 2.25 g     2.25 g 100 mL/hr over 30 Minutes Intravenous 4 times per day 09/16/2013 0600     09/20/2013 0100  piperacillin-tazobactam (ZOSYN) IVPB 3.375 g     3.375 g 100 mL/hr over 30 Minutes Intravenous  Once 10/05/2013 0057 09/27/2013 0232   09/22/2013 0100  vancomycin (VANCOCIN) IVPB 1000 mg/200 mL premix     1,000 mg 200 mL/hr over 60 Minutes Intravenous  Once 09/18/2013 0057 09/21/2013 0419      Assessment: Patient with vancomycin level drawn before reach steady state.  Vancomycin level not above goal.  Goal of Therapy:  Vancomycin trough level 15-20 mcg/ml  Plan:  Measure antibiotic drug levels at steady state Follow up culture results Continue current vancomycin  Scott Reese, Scott Reese 09/12/2013,6:53 AM

## 2013-10-07 NOTE — Progress Notes (Signed)
30ml of 1mg /ml Versed and 85ml of 1mg /ml of morphine wasted in sink, witness by Caesar Chestnutuby Johnson, RN

## 2013-10-07 NOTE — Progress Notes (Addendum)
Patient ID: Scott Reese, male   DOB: Nov 01, 1927, 80 y.o.   MRN: [id number]  Date of Encounter: 09/15/2013   Principal Problem:   Acute respiratory failure with hypoxia Active Problems:   Chronic atrial fibrillation   Atrial fibrillation with rapid ventricular response   COPD exacerbation   Cirrhosis of liver with ascites   Hyperkalemia   Elevated brain natriuretic peptide (BNP) level   Cirrhosis   Acute renal failure syndrome   Prostatitis   Urinary retention   Adult failure to thrive   Acute respiratory failure   SUBJECTIVE The patient remained sedated on the ventilator.  No apparent distress. Rhythm atrial fibrillation with HR now in 110-120 range -- CCB held o/n for BP in 90s.  CURRENT MEDS . allopurinol  100 mg Oral Daily  . budesonide-formoterol  2 puff Inhalation BID  . chlorhexidine  15 mL Mouth/Throat QID  . famotidine (PEPCID) IV  20 mg Intravenous QHS  . fentaNYL  50 mcg Intravenous Once  . insulin aspart  0-9 Units Subcutaneous TID WC  . ipratropium-albuterol  3 mL Nebulization Q6H  . methylPREDNISolone (SOLU-MEDROL) injection  60 mg Intravenous 4 times per day  . piperacillin-tazobactam (ZOSYN)  IV  2.25 g Intravenous 4 times per day  . sodium chloride  3 mL Intravenous Q12H  . sodium polystyrene  30 g Rectal Q12H  . sodium polystyrene  45 g Per Tube Once  . vancomycin  1,000 mg Intravenous Q48H    OBJECTIVE  Filed Vitals:   09/17/2013 0200 09/10/2013 0300 09/28/2013 0400 09/06/2013 0500  BP: 136/48 106/74 107/57 107/55  Pulse: 95 58 53 75  Temp:   98 F (36.7 C)   TempSrc:   Axillary   Resp: 14 14 14 14   Height:      Weight:   188 lb 11.4 oz (85.6 kg)   SpO2: 100% 100% 100% 100%    Intake/Output Summary (Last 24 hours) at 09/19/2013 0658 Last data filed at 09/21/2013 0519  Gross per 24 hour  Intake 885.41 ml  Output     72 ml  Net 813.41 ml   Filed Weights   2013/08/30 0545 10/01/13 0001 09/29/2013 0400  Weight: 178 lb 9.2 oz (81 kg) 183 lb 3.2 oz (83.1  kg) 188 lb 11.4 oz (85.6 kg)    PHYSICAL EXAM  General: Intubated. Sedated. Arousable, but not meaningful. Lungs:  Resp regular and unlabored, CTA anteriorly. Heart: Irregular heart rhythm.  Soft apical murmur of mitral regurgitation. Abdomen: Distended.  Distant bowel sounds. Extremities: No clubbing, cyanosis or edema. DP/PT/Radials 2+ and equal bilaterally.  Accessory Clinical Findings  CBC  Recent Labs  2013/08/30 0057  10/01/13 0405 09/22/2013 0512  WBC 11.0*  < > 13.2* 10.3  NEUTROABS 9.5*  --   --   --   HGB 12.7*  < > 11.2* 10.3*  HCT 40.6  < > 36.9* 34.4*  MCV 77.8*  < > 80.0 81.3  PLT 367  < > 276 233  < > = values in this interval not displayed. Basic Metabolic Panel  Recent Labs  10/01/13 0405 10/01/13 1352 09/20/2013 0512  NA 139 140 139  K 6.4* 6.2* 6.5*  CL 98 100 97  CO2 23 22 22   GLUCOSE 144* 124* 134*  BUN 116* 116* 126*  CREATININE 5.29* 5.48* 6.19*  CALCIUM 8.8 8.7 8.9  MG 4.5*  --  4.6*  PHOS 10.0*  --  10.4*   Liver Function Tests  Recent Labs  09/18/2013 1219 10/01/13 0405  AST 40* 31  ALT 24 19  ALKPHOS 86 71  BILITOT 0.3 0.3  PROT 6.8 6.2  ALBUMIN 2.2* 2.0*   No results found for this basename: LIPASE, AMYLASE,  in the last 72 hours Cardiac Enzymes  Recent Labs  10/03/2013 1219 10/01/2013 1759 10/01/13 0005  TROPONINI <0.30 <0.30 <0.30  TELE Atrial fibrillation with variable ventricular response  ECG  Radiology/Studies  CXR - read pending.   ASSESSMENT AND PLAN 80 year old gentleman with chronic atrial fibrillation on Coumadin, chronic alcohol abuse, chronic smoker and COPD admitted with abdominal pain and weakness. No    Post respiratory arrest.  Question aspiration pneumonia..  1. Chronic diastolic congestive heart failure.  2. mitral regurgitation  3. COPD  4. permanent atrial fibrillation  5. 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 ischemic heart disease  7. status post cardiac arrest secondary to  vomiting and aspiration  8. bifascicular block  9. Acute on chronic kidney disease with Hyperkalemia  Plan: Continue supportive care.  Rate improved with BB & off of CCB gtt overnight but now up this AM - will restart CCB IV for rate control.  Will write hold parameters -- convert to Per OGT once rate is stable. K+ still in ~6-6.5 range -- receiving Kayexalate; Renal Fxn worsening.  Would consider Lasix as an additional option. Palliative Care Team involved.    Will monitor HR & assist if needed.  Otherwise will sign off.  Signed, Marykay Lex, MD

## 2013-10-07 DEATH — deceased

## 2013-10-11 NOTE — Discharge Summary (Signed)
PULMONARY / CRITICAL CARE MEDICINE   Name: Scott Reese MRN: [id number] DOB: 06/07/1927    ADMISSION DATE:  09/26/2013 CONSULTATION DATE:  09/12/2013  REFERRING MD :  Izola Price  REASON FOR CONSULTATION:  Aspiration and acute resp failure  INITIAL PRESENTATION:  69 alcoholic admitted 7/25 with acute-on-chronic renal failure, altered MS, urinary retention, ascites, possible mild ileus. Experienced emesis, witnessed aspiration and then resp arrest 7/25 > intubated and underwent brief CPR. PCCM assumed care 7/25. PMH - COPD FEV1 1.46-50%   STUDIES:  7/25 CT abd/ pelvis >> cirrhosis, moderate to large ascites, proximal and mid SB distension and fluid, suspected ileus. R effusion with associated atx.  7/25 Abd [place] > no hydronephrosis. There is cirrhosis and moderate ascites  SIGNIFICANT EVENTS: 7/25 Admitted with AMS, renal failure, A Fib + RVR  7/25 resp arrest after emesis and aspiration 7/25 Limits on care placed per family discussions and pt's wishes > no CPR, ACLS. No HD.  7/25 ETT was repositioned and MV was changed  after episode when pt did not have any return volumes    ASSESSMENT / PLAN:  PULMONARY OETT 7/25 >>  A: Acute respiratory failure Aspiration PNA COPD, tobacco hx P:   - PRVC ventilation  - scheduled BD's - continue solumedrol for suspected component AE-COPD   CARDIOVASCULAR CVL none A: A fib with RVR CAD, serial trop negative Hypotension P:  - dilt gtt and metoprolol restarted - phenylephrine acceptable via PIV if needed for MAP > 60 - hold anticoagulation at this time (on coumadin outpt)  RENAL A:  Acute renal failure, suspect due to dehydration and poor PO intake over the last week.  Renal US neg for  obstructive nephropathy  Hyperkalemia P:   - kayexalate via NGT - follow BMP and treat hyperkalemia as indicated   GASTROINTESTINAL A:  Cirrhosis Small Bowel Obstruction Ascites Possible GIB > dark emesis P:   - OGT to suction   HEMATOLOGIC A:   Coagulopathy - on coumadin, liver disease, resolved with vit K P:  - hold coumadin  - follow LFT  INFECTIOUS A:  Suspected prostatitis on presentation High risk for aspiration PNA At risk for SBP P:   BCx2 7/25 >> ng UC 7/25 >> ng Sputum7/25 >>  Abx:  Vanco, start date 7/25  >> October 30, 2022  Zosyn, start date 7/25  ENDOCRINE A:  Hyperglycemia, on steroids P:   SSI per protocol  NEUROLOGIC A:  Sedation while ventilated P:   RASS goal: -1 to 0 - fentanyl gtt  - versed prn   COURSE :  WOrsening  renal failure, altered MS, superimposed on cirrhosis, COPD, A fib-RVR, CAD. Unfortunately experienced emesis, aspiration and resp arrest requiring ETT/MV 7/25. Treating hyperkalemia, following for any renal recovery.W  Family discussion 7/25: Dr Delton Coombes had a family meeting with the patient's wife, grandson and granddaughter 7/25. Explained the events and his illness on presentation. I have explained that prognosis for a meaningful recovery to his previous good QOL is poor but not impossible. They all agree that he would not want a life that was characterized by dependent care, SNF, etc. DNR , no HD .  2022/10/30 - After further discussion with wife , grandson & other family, ventilator was withdrawn on 2022/10/30 & he passed away soon after.Please see social worker note for grandson's request prior to withdrawal.  CAUSE of DEATH : Acute respiratory failure, Aspiration pneumonia, AKI, underlying COPD, cirrhosis, ascites  Cyril Mourning MD. FCCP. [place] [facility] Pager  [phone] If no response call [phone]      10/11/2013, 7:04 PM

## 2013-11-20 ENCOUNTER — Ambulatory Visit: Payer: Medicare Other | Admitting: Cardiology

## 2013-11-20 ENCOUNTER — Other Ambulatory Visit: Payer: Medicare Other

## 2014-11-18 IMAGING — CT CT CHEST W/O CM
1 of 5 series · 4 of 36 positions shown, 5 images · non-contrast
Comparison: None

CLINICAL DATA: Chest pain.

CT CHEST WITHOUT CONTRAST
TECHNIQUE: Multidetector CT imaging of the chest was performed
following the standard protocol without IV contrast. High-
resolution imaging was performed.

[Series 602: coronals · coronal · 0.83mm/px · 4 of 116 slices shown, 5 images]
[im 24/116  mediastinal]
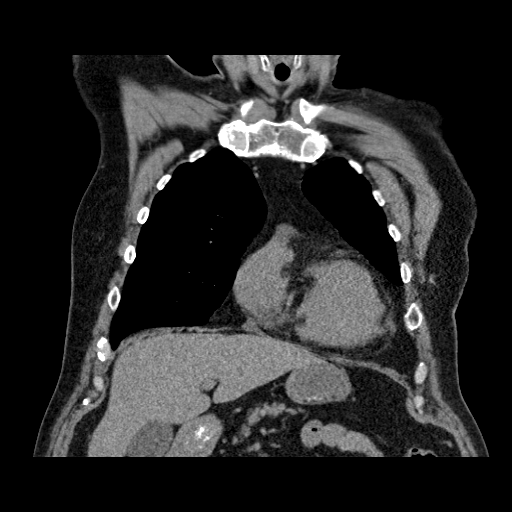
[im 24/116  lung]
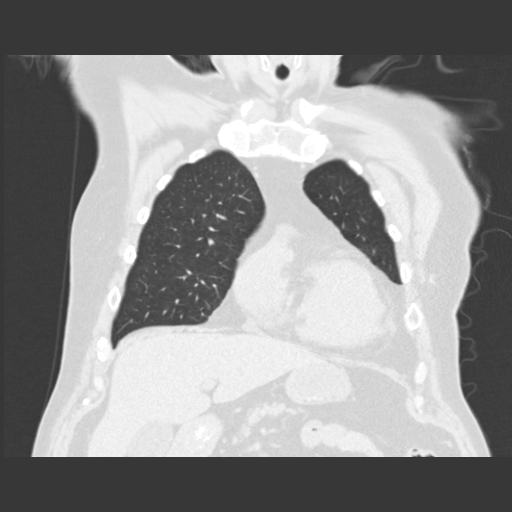
[im 47/116  lung]
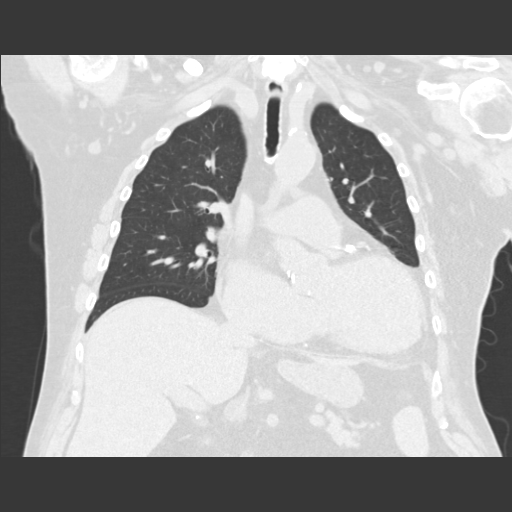
[im 70/116  lung]
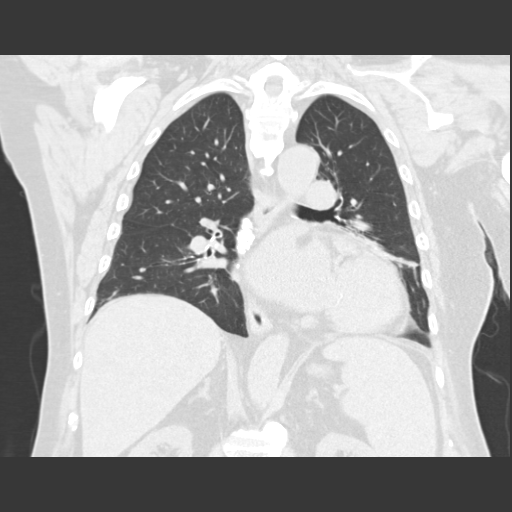
[im 93/116  lung]
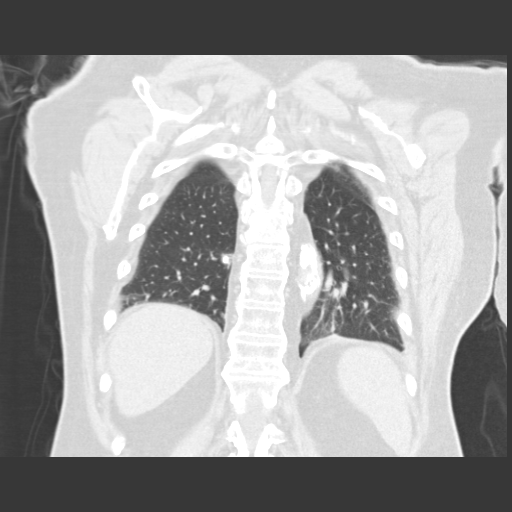

[4 of 36 positions shown; findings below may reference images not displayed]

FINDINGS: The chest wall is unremarkable.  No supraclavicular or
axillary mass or lymphadenopathy.  Small scattered nodes are noted.
Small bilateral thyroid gland nodules are noted.  The bony thorax
is intact.  No destructive bone lesions or spinal canal compromise.
Moderate degenerative changes involving the thoracic spine.

The heart is mildly enlarged.  A small amount of pericardial fluid
is noted but no overt effusion.  No mediastinal or hilar
lymphadenopathy.  Small scattered lymph nodes are noted.  Densely
calcified subcarinal nodes are noted.  The aorta is normal in
caliber.  Mild atherosclerotic calcifications involving the
descending aorta.  There are dense coronary artery calcifications.
The esophagus is grossly normal.

Examination of the lung parenchyma demonstrates no acute pulmonary
findings.  No infiltrates, edema or effusions.  There is a 11 mm
calcified granuloma in the right lower lobe along with small
calcified right hilar nodes.  Calcifications are noted throughout
the tracheobronchial tree.

High-resolution imaging demonstrates no findings for interstitial
lung disease.  No bronchiectasis.  Bibasilar scarring changes are
noted.

The upper abdomen is unremarkable. Dense aortic and branch vessel
calcifications are noted.
IMPRESSION: 1. Mild cardiac enlargement.
2.  No acute or significant pulmonary findings.  There is bibasilar
scarring and atelectasis.  No findings for interstitial lung
disease or emphysema.
3.  Calcified granulomas in the right lower lobe with calcified
right hilar and subcarinal lymph nodes.
4.  Atherosclerotic calcification involving the aorta and branch
vessels.
